# Patient Record
Sex: Male | Born: 1990 | Race: White | Hispanic: No | Marital: Married | Smoking: Former smoker
Health system: Southern US, Community
[De-identification: ages and names within clinical notes are randomized; demographics above are authoritative.]

## PROBLEM LIST (undated history)

## (undated) DIAGNOSIS — K219 Gastro-esophageal reflux disease without esophagitis: Secondary | ICD-10-CM

## (undated) DIAGNOSIS — F32A Depression, unspecified: Secondary | ICD-10-CM

## (undated) DIAGNOSIS — F509 Eating disorder, unspecified: Secondary | ICD-10-CM

## (undated) HISTORY — DX: Depression, unspecified: F32.A

## (undated) HISTORY — DX: Gastro-esophageal reflux disease without esophagitis: K21.9

## (undated) HISTORY — DX: Eating disorder, unspecified: F50.9

---
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
## 2013-12-30 ENCOUNTER — Ambulatory Visit: Payer: Self-pay | Admitting: Physician Assistant

## 2015-02-18 IMAGING — CR LEFT WRIST - COMPLETE 3+ VIEW
1 series · 5 of 5 positions shown · non-contrast
Comparison: None.

CLINICAL DATA: Left wrist pain.

EXAM:
LEFT WRIST - COMPLETE 3+ VIEW

[Series 1: lat · 0.17mm/px · 5 of 5 slices shown]
[im 1/5]
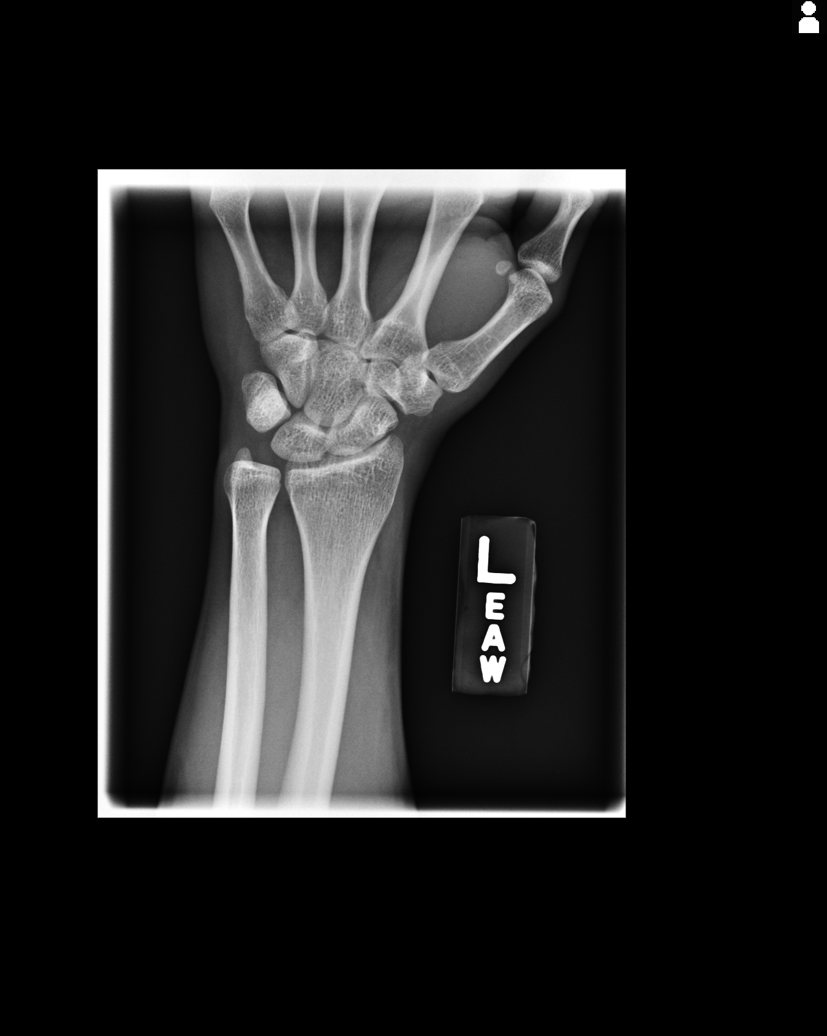
[im 2/5]
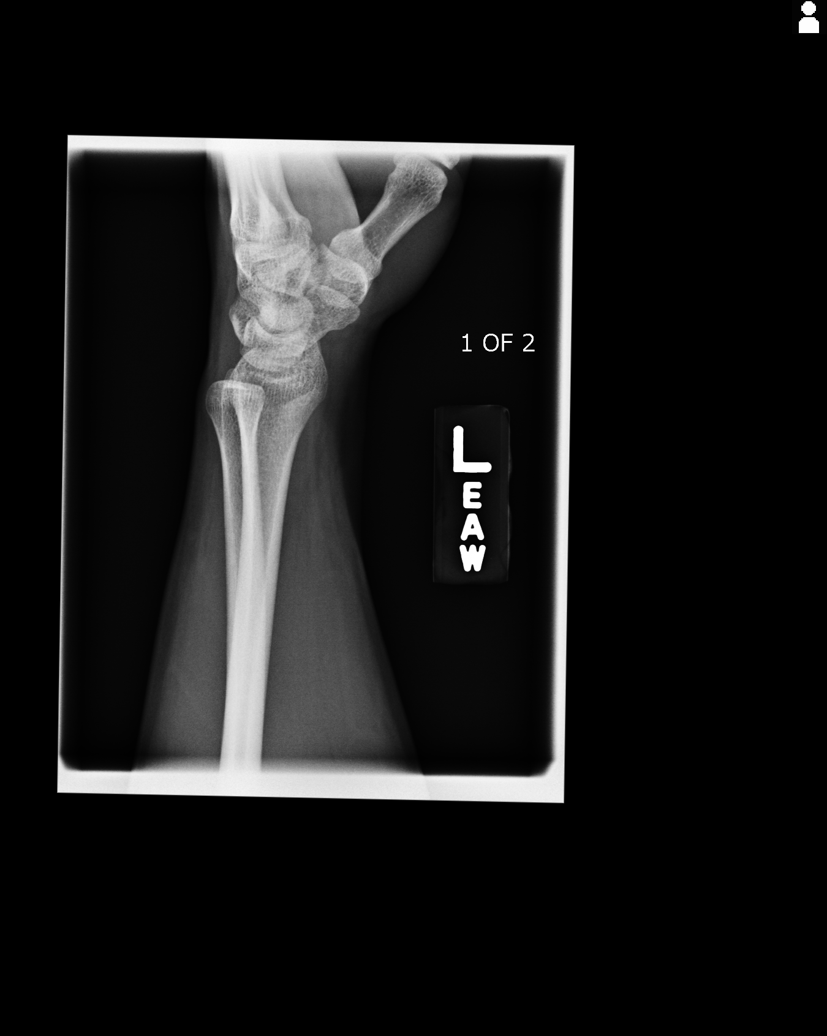
[im 3/5]
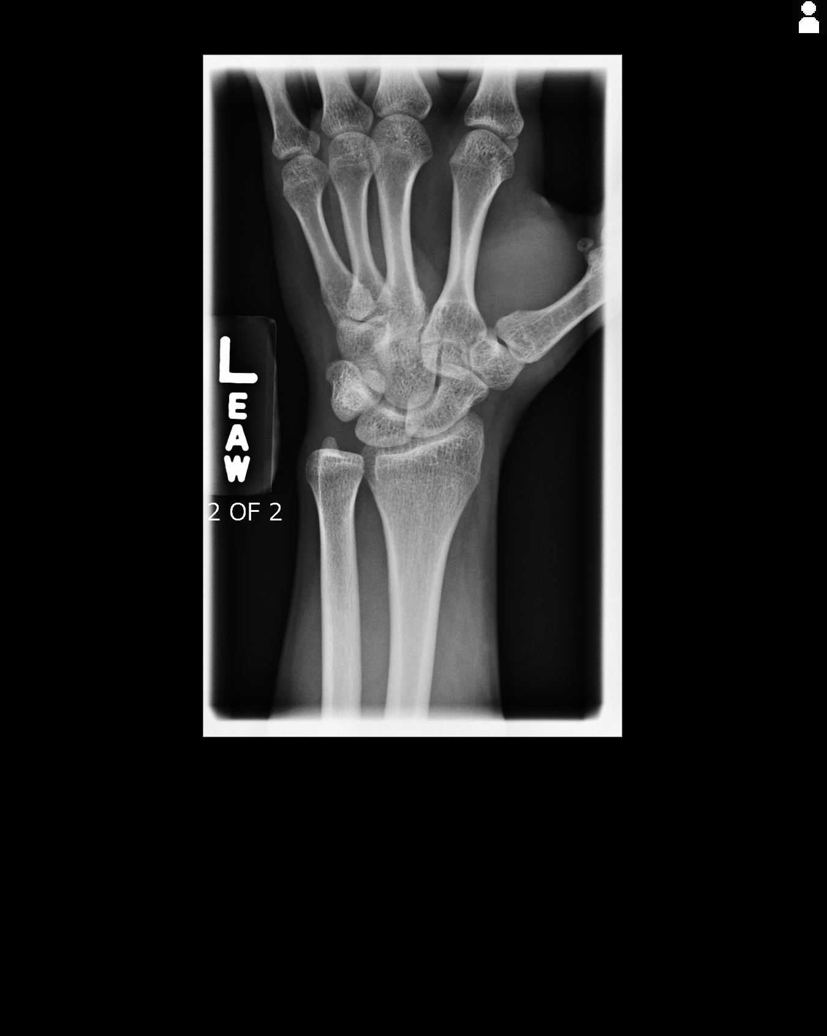
[im 4/5]
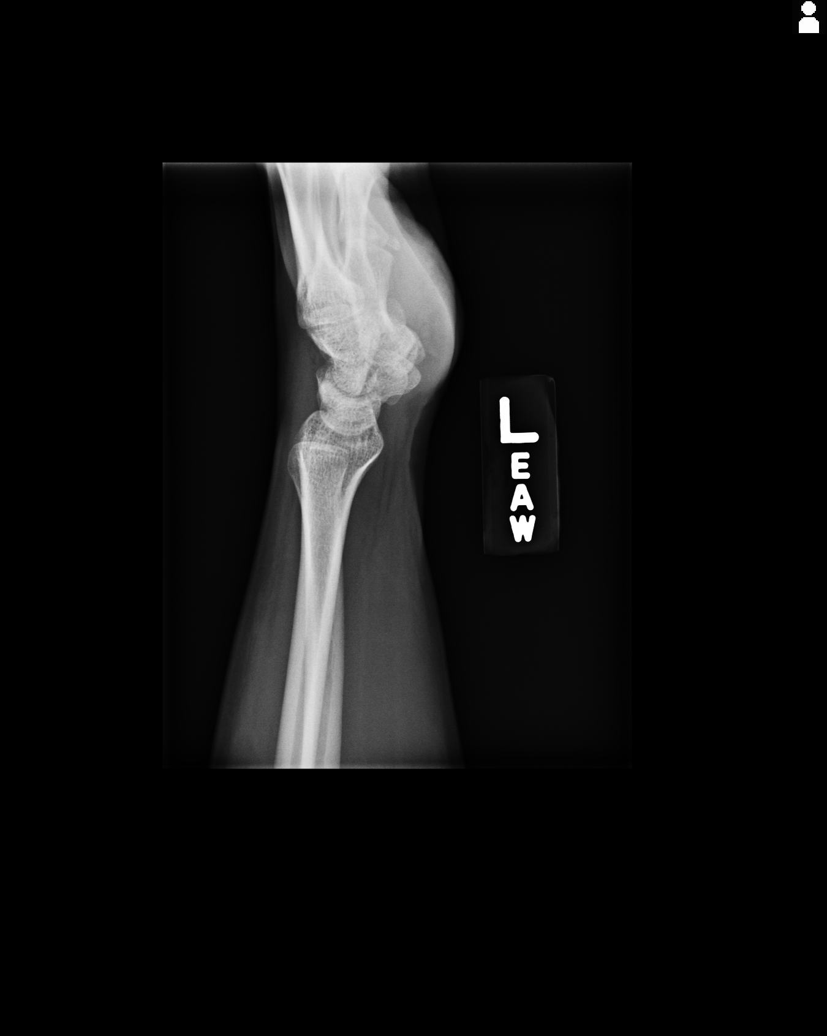
[im 5/5]
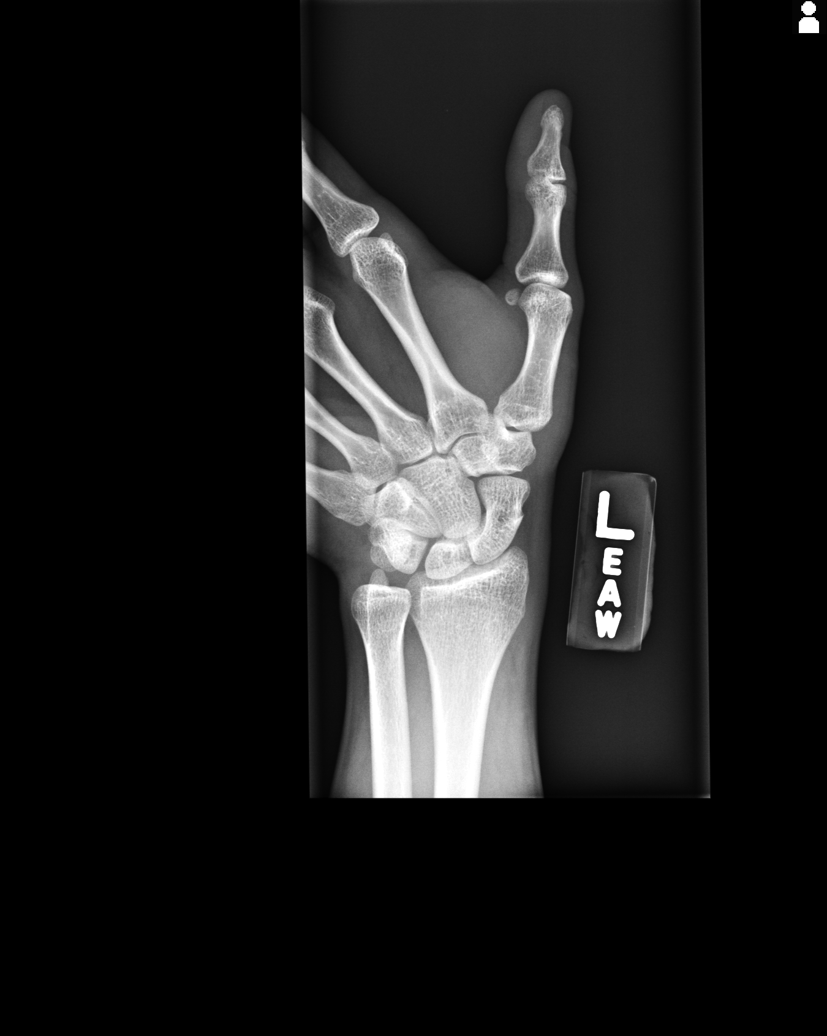

[5 of 5 positions shown; findings below may reference images not displayed]

FINDINGS: There is no evidence of fracture or dislocation. There is no
evidence of arthropathy or other focal bone abnormality. Soft
tissues are unremarkable.
IMPRESSION: Normal left wrist.

## 2015-12-13 ENCOUNTER — Ambulatory Visit
Admission: EM | Admit: 2015-12-13 | Discharge: 2015-12-13 | Disposition: A | Discharge status: Home / Self Care | Payer: Self-pay | Attending: Family Medicine | Admitting: Family Medicine

## 2015-12-13 ENCOUNTER — Encounter: Payer: Self-pay | Admitting: Emergency Medicine

## 2015-12-13 ENCOUNTER — Ambulatory Visit (INDEPENDENT_AMBULATORY_CARE_PROVIDER_SITE_OTHER): Payer: Self-pay

## 2015-12-13 DIAGNOSIS — S99922A Unspecified injury of left foot, initial encounter: Secondary | ICD-10-CM

## 2015-12-13 MED ORDER — NAPROXEN 500 MG PO TABS: 500.0000 mg | ORAL_TABLET | Freq: Two times a day (BID) | ORAL | Status: DC

## 2015-12-13 NOTE — ED Notes (Signed)
Patient states that on New Years Eve he jumped off an 298ft wall and landed on his left heel.  Patient c/o pain in his left heel for that past 2 weeks.

## 2015-12-13 NOTE — ED Provider Notes (Signed)
Patient presents today with symptoms of left heel pain. Patient states that he jumped off a 8 foot wall on New Year's Eve. He states that he has been wearing his boot since the injury. He states that the boot has been helping his pain. He denies any other injury anywhere else. He does state that there was bruising initially however it is now gone.  ROS: Negative except mentioned above.  Vitals as per Epic. GENERAL: NAD MSK: Left Foot- no ecchymosis or swelling appreciated, there is mild to moderate tenderness at the insertion of the plantar fascia at the calcaneus, there is pain upon squeezing the calcaneus, there is no arch pain or other foot pain, full range of motion, NV intact  NEURO: CN II-XII grossly intact   A/P: L Heel Injury- Xrays of the foot didn't show any fracture, will refer patient to orthopedics for further evaluation and treatment, patient may require MRI to evaluate for fracture, etc. Patient states that he will continue to wear the boot that he has been wearing, Naprosyn prescribed to use when necessary, discussed icing the area as well.   Jolene ProvostKirtida Brigitte Soderberg, MD 12/13/15 1719

## 2019-11-11 ENCOUNTER — Other Ambulatory Visit: Payer: Self-pay

## 2019-11-11 ENCOUNTER — Other Ambulatory Visit
Admission: RE | Admit: 2019-11-11 | Discharge: 2019-11-11 | Disposition: A | Discharge status: Home / Self Care | Payer: Self-pay | Source: Ambulatory Visit | Attending: Gastroenterology | Admitting: Gastroenterology

## 2019-11-11 ENCOUNTER — Ambulatory Visit: Payer: Self-pay | Admitting: Gastroenterology

## 2019-11-11 ENCOUNTER — Telehealth: Payer: Self-pay | Admitting: Gastroenterology

## 2019-11-11 ENCOUNTER — Encounter: Payer: Self-pay | Admitting: Gastroenterology

## 2019-11-11 VITALS — BP 105/64 | HR 67 | Temp 98.9°F | Ht 66.0 in | Wt 147.0 lb

## 2019-11-11 DIAGNOSIS — Z01812 Encounter for preprocedural laboratory examination: Secondary | ICD-10-CM | POA: Insufficient documentation

## 2019-11-11 DIAGNOSIS — R1319 Other dysphagia: Secondary | ICD-10-CM

## 2019-11-11 DIAGNOSIS — Z20828 Contact with and (suspected) exposure to other viral communicable diseases: Secondary | ICD-10-CM | POA: Insufficient documentation

## 2019-11-11 DIAGNOSIS — R1310 Dysphagia, unspecified: Secondary | ICD-10-CM

## 2019-11-11 LAB — SARS CORONAVIRUS 2 (TAT 6-24 HRS): SARS Coronavirus 2: NEGATIVE

## 2019-11-11 NOTE — Telephone Encounter (Signed)
Patient called & was unable to get her refill.She has  Made an  appointment for 01-31-20. She is asking for Polyethlen Glycol 3350 powder,Bental 20mg  (1tab 4x's aday as needed) & Prilosec 20 mg (2x's a day) called into [facility] in [place]. Please call to confirm medication was called in or not.    (DR Vanga's patient)

## 2019-11-11 NOTE — Telephone Encounter (Signed)
This message was sent by the front desk in error. Disregard.

## 2019-11-11 NOTE — Progress Notes (Signed)
Gastroenterology Consultation  Referring Provider:     No ref. provider found Primary Care Physician:  Patient, No Pcp Per Primary Gastroenterologist:  Dr. Allen Norris     Reason for Consultation:     Dysphagia        HPI:   Brad Nichols is a 28 y.o. y/o male referred for consultation & management of dysphagia by Dr. Patient, No Pcp Per.  This patient comes in today with a report of dysphagia that has been going on for the last 80 days.  The patient states that he has a 14 lbs lost in that time.  The patient was seen by ENT and recommended to come see me.  He states that he has a protein drink in the morning with yogurt and broth.  The patient continues to lose weight despite doing this.  He also reports that he is expecting his first child and has been under a lot of stress.  He was tried on a PPI without much relief.  He reports that the symptoms are worse with solids than they are with liquids and he is able to tolerate liquids.  The patient denies any vomiting of blood.  The patient also reports that the PPI made him constipated and he did have some rectal bleeding when he was constipated.  He reports that that has stopped.  History reviewed. No pertinent past medical history.  History reviewed. No pertinent surgical history.  Prior to Admission medications   Medication Sig Start Date End Date Taking? Authorizing Provider  naproxen (NAPROSYN) 500 MG tablet Take 1 tablet (500 mg total) by mouth 2 (two) times daily. Take with food. 12/13/15   Paulina Fusi, MD    History reviewed. No pertinent family history.   Social History   Tobacco Use  . Smoking status: Never Smoker  . Smokeless tobacco: Never Used  Substance Use Topics  . Alcohol use: Yes  . Drug use: Never    Allergies as of 11/11/2019  . (No Known Allergies)    Review of Systems:    All systems reviewed and negative except where noted in HPI.   Physical Exam:  BP 105/64   Pulse 67   Temp 98.9 F (37.2 C) (Oral)    Ht 5\' 6"  (1.676 m)   Wt 147 lb (66.7 kg)   BMI 23.73 kg/m  No LMP for male patient. General:   Alert,  Well-developed, well-nourished, pleasant and cooperative in NAD Head:  Normocephalic and atraumatic. Eyes:  Sclera clear, no icterus.   Conjunctiva pink. Ears:  Normal auditory acuity. Neck:  Supple; no masses or thyromegaly. Lungs:  Respirations even and unlabored.  Clear throughout to auscultation.   No wheezes, crackles, or rhonchi. No acute distress. Heart:  Regular rate and rhythm; no murmurs, clicks, rubs, or gallops. Abdomen:  Normal bowel sounds.  No bruits.  Soft, non-tender and non-distended without masses, hepatosplenomegaly or hernias noted.  No guarding or rebound tenderness.  Negative Carnett sign.   Rectal:  Deferred.  Msk:  Symmetrical without gross deformities.  Good, equal movement & strength bilaterally. Pulses:  Normal pulses noted. Extremities:  No clubbing or edema.  No cyanosis. Neurologic:  Alert and oriented x3;  grossly normal neurologically. Skin:  Intact without significant lesions or rashes.  No jaundice. Lymph Nodes:  No significant cervical adenopathy. Psych:  Alert and cooperative. Normal mood and affect.  Imaging Studies: No results found.  Assessment and Plan:   Brad Nichols is a 28  y.o. y/o male who comes in today with 80 days of dysphagia and a 14 pound weight loss.  The patient has dysphagia to solids more than liquids but reports that it has not gotten better.  The patient is now quite concerned because of the dysphagia and the weight loss and it is adding to the stress of him expecting his first child.  The patient will be set up for an EGD to look for any strictures narrowing or signs of eosinophilic esophagitis.  The patient has been explained the plan and agrees with it.    Midge Minium, MD. Clementeen Graham    Note: This dictation was prepared with Dragon dictation along with smaller phrase technology. Any transcriptional errors that result  from this process are unintentional.

## 2019-11-11 NOTE — H&P (View-Only) (Signed)
Gastroenterology Consultation  Referring Provider:     No ref. provider found Primary Care Physician:  Brad Nichols, No Pcp Per Primary Gastroenterologist:  Dr. Allen Norris     Reason for Consultation:     Dysphagia        HPI:   Brad Nichols is a 28 y.o. y/o male referred for consultation & management of dysphagia by Dr. Patient, No Pcp Per.  This Brad Nichols comes in today with a report of dysphagia that has been going on for the last 80 days.  The Brad Nichols states that he has a 14 lbs lost in that time.  The Brad Nichols was seen by ENT and recommended to come see me.  He states that he has a protein drink in the morning with yogurt and broth.  The Brad Nichols continues to lose weight despite doing this.  He also reports that he is expecting his first child and has been under a lot of stress.  He was tried on a PPI without much relief.  He reports that the symptoms are worse with solids than they are with liquids and he is able to tolerate liquids.  The Brad Nichols denies any vomiting of blood.  The Brad Nichols also reports that the PPI made him constipated and he did have some rectal bleeding when he was constipated.  He reports that that has stopped.  History reviewed. No pertinent past medical history.  History reviewed. No pertinent surgical history.  Prior to Admission medications   Medication Sig Start Date End Date Taking? Authorizing Provider  naproxen (NAPROSYN) 500 MG tablet Take 1 tablet (500 mg total) by mouth 2 (two) times daily. Take with food. 12/13/15   Paulina Fusi, MD    History reviewed. No pertinent family history.   Social History   Tobacco Use  . Smoking status: Never Smoker  . Smokeless tobacco: Never Used  Substance Use Topics  . Alcohol use: Yes  . Drug use: Never    Allergies as of 11/11/2019  . (No Known Allergies)    Review of Systems:    All systems reviewed and negative except where noted in HPI.   Physical Exam:  BP 105/64   Pulse 67   Temp 98.9 F (37.2 C) (Oral)    Ht 5\' 6"  (1.676 m)   Wt 147 lb (66.7 kg)   BMI 23.73 kg/m  No LMP for male Brad Nichols. General:   Alert,  Well-developed, well-nourished, pleasant and cooperative in NAD Head:  Normocephalic and atraumatic. Eyes:  Sclera clear, no icterus.   Conjunctiva pink. Ears:  Normal auditory acuity. Neck:  Supple; no masses or thyromegaly. Lungs:  Respirations even and unlabored.  Clear throughout to auscultation.   No wheezes, crackles, or rhonchi. No acute distress. Heart:  Regular rate and rhythm; no murmurs, clicks, rubs, or gallops. Abdomen:  Normal bowel sounds.  No bruits.  Soft, non-tender and non-distended without masses, hepatosplenomegaly or hernias noted.  No guarding or rebound tenderness.  Negative Carnett sign.   Rectal:  Deferred.  Msk:  Symmetrical without gross deformities.  Good, equal movement & strength bilaterally. Pulses:  Normal pulses noted. Extremities:  No clubbing or edema.  No cyanosis. Neurologic:  Alert and oriented x3;  grossly normal neurologically. Skin:  Intact without significant lesions or rashes.  No jaundice. Lymph Nodes:  No significant cervical adenopathy. Psych:  Alert and cooperative. Normal mood and affect.  Imaging Studies: No results found.  Assessment and Plan:   [name] is a 28  y.o. y/o male who comes in today with 80 days of dysphagia and a 14 pound weight loss.  The Brad Nichols has dysphagia to solids more than liquids but reports that it has not gotten better.  The Brad Nichols is now quite concerned because of the dysphagia and the weight loss and it is adding to the stress of him expecting his first child.  The Brad Nichols will be set up for an EGD to look for any strictures narrowing or signs of eosinophilic esophagitis.  The Brad Nichols has been explained the plan and agrees with it.    Midge Minium, MD. Clementeen Graham    Note: This dictation was prepared with Dragon dictation along with smaller phrase technology. Any transcriptional errors that result  from this process are unintentional.

## 2019-11-11 NOTE — Telephone Encounter (Signed)
Patient had a appointment today with Dr. Allen Norris please advised

## 2019-11-16 ENCOUNTER — Ambulatory Visit: Payer: Self-pay | Admitting: Certified Registered Nurse Anesthetist

## 2019-11-16 ENCOUNTER — Encounter: Payer: Self-pay | Admitting: Gastroenterology

## 2019-11-16 ENCOUNTER — Encounter
Admission: RE | Disposition: A | Discharge status: Home / Self Care | Payer: Self-pay | Source: Home / Self Care | Attending: Gastroenterology

## 2019-11-16 ENCOUNTER — Ambulatory Visit
Admission: RE | Admit: 2019-11-16 | Discharge: 2019-11-16 | Disposition: A | Discharge status: Home / Self Care | Payer: Self-pay | Attending: Gastroenterology | Admitting: Gastroenterology

## 2019-11-16 DIAGNOSIS — R1310 Dysphagia, unspecified: Secondary | ICD-10-CM | POA: Insufficient documentation

## 2019-11-16 DIAGNOSIS — R1319 Other dysphagia: Secondary | ICD-10-CM

## 2019-11-16 DIAGNOSIS — Z791 Long term (current) use of non-steroidal anti-inflammatories (NSAID): Secondary | ICD-10-CM | POA: Insufficient documentation

## 2019-11-16 HISTORY — PX: ESOPHAGOGASTRODUODENOSCOPY (EGD) WITH PROPOFOL: SHX5813

## 2019-11-16 SURGERY — ESOPHAGOGASTRODUODENOSCOPY (EGD) WITH PROPOFOL
Anesthesia: General

## 2019-11-16 MED ORDER — SODIUM CHLORIDE 0.9 % IV SOLN: INTRAVENOUS | Status: DC

## 2019-11-16 MED ORDER — PROPOFOL 10 MG/ML IV BOLUS
INTRAVENOUS | Status: DC | PRN
  Administered 2019-11-16: 18 mg via INTRAVENOUS
  Administered 2019-11-16: 60 mg via INTRAVENOUS
  Administered 2019-11-16: 18 mg via INTRAVENOUS

## 2019-11-16 MED ORDER — FENTANYL CITRATE (PF) 100 MCG/2ML IJ SOLN
INTRAMUSCULAR | Status: DC | PRN
  Administered 2019-11-16 (×2): 50 ug via INTRAVENOUS

## 2019-11-16 MED ORDER — MIDAZOLAM HCL 2 MG/2ML IJ SOLN
INTRAMUSCULAR | Status: DC | PRN
  Administered 2019-11-16: 2 mg via INTRAVENOUS

## 2019-11-16 MED ORDER — MIDAZOLAM HCL 2 MG/2ML IJ SOLN
INTRAMUSCULAR | Status: AC
  Filled 2019-11-16: qty 2

## 2019-11-16 MED ORDER — PROPOFOL 500 MG/50ML IV EMUL
INTRAVENOUS | Status: DC | PRN
  Administered 2019-11-16: 140 ug/kg/min via INTRAVENOUS

## 2019-11-16 MED ORDER — PROPOFOL 500 MG/50ML IV EMUL
INTRAVENOUS | Status: AC
  Filled 2019-11-16: qty 50

## 2019-11-16 MED ORDER — FENTANYL CITRATE (PF) 100 MCG/2ML IJ SOLN
INTRAMUSCULAR | Status: AC
  Filled 2019-11-16: qty 2

## 2019-11-16 MED ORDER — LIDOCAINE HCL (CARDIAC) PF 100 MG/5ML IV SOSY
PREFILLED_SYRINGE | INTRAVENOUS | Status: DC | PRN
  Administered 2019-11-16: 50 mg via INTRAVENOUS

## 2019-11-16 NOTE — Transfer of Care (Signed)
Immediate Anesthesia Transfer of Care Note  Patient: Brad Nichols  Procedure(s) Performed: ESOPHAGOGASTRODUODENOSCOPY (EGD) WITH PROPOFOL (N/A )  Patient Location: PACU and Endoscopy Unit  Anesthesia Type:General  Level of Consciousness: drowsy  Airway & Oxygen Therapy: Patient Spontanous Breathing  Post-op Assessment: Report given to RN and Post -op Vital signs reviewed and stable  Post vital signs: Reviewed and stable  Last Vitals:  Vitals Value Taken Time  BP 82/55 11/16/19 1054  Temp    Pulse 54 11/16/19 1055  Resp 8 11/16/19 1055  SpO2 95 % 11/16/19 1055  Vitals shown include unvalidated device data.  Last Pain:  Vitals:   11/16/19 0943  TempSrc: Temporal  PainSc: 0-No pain         Complications: No apparent anesthesia complications

## 2019-11-16 NOTE — Anesthesia Post-op Follow-up Note (Signed)
Anesthesia QCDR form completed.        

## 2019-11-16 NOTE — Anesthesia Preprocedure Evaluation (Signed)
Anesthesia Evaluation  Patient identified by MRN, date of birth, ID band Patient awake    Reviewed: Allergy & Precautions, NPO status , Patient's Chart, lab work & pertinent test results  Airway Mallampati: II  TM Distance: >3 FB     Dental   Pulmonary neg pulmonary ROS,    Pulmonary exam normal        Cardiovascular negative cardio ROS Normal cardiovascular exam     Neuro/Psych negative neurological ROS  negative psych ROS   GI/Hepatic Neg liver ROS,   Endo/Other  negative endocrine ROS  Renal/GU negative Renal ROS  negative genitourinary   Musculoskeletal negative musculoskeletal ROS (+)   Abdominal Normal abdominal exam  (+)   Peds negative pediatric ROS (+)  Hematology negative hematology ROS (+)   Anesthesia Other Findings History reviewed. No pertinent past medical history.  Reproductive/Obstetrics                             Anesthesia Physical Anesthesia Plan  ASA: I  Anesthesia Plan: General   Post-op Pain Management:    Induction: Intravenous  PONV Risk Score and Plan:   Airway Management Planned: Nasal Cannula  Additional Equipment:   Intra-op Plan:   Post-operative Plan:   Informed Consent: I have reviewed the patients History and Physical, chart, labs and discussed the procedure including the risks, benefits and alternatives for the proposed anesthesia with the patient or authorized representative who has indicated his/her understanding and acceptance.     Dental advisory given  Plan Discussed with: CRNA and Surgeon  Anesthesia Plan Comments:         Anesthesia Quick Evaluation

## 2019-11-16 NOTE — Anesthesia Postprocedure Evaluation (Signed)
Anesthesia Post Note  Patient: Brad Nichols  Procedure(s) Performed: ESOPHAGOGASTRODUODENOSCOPY (EGD) WITH PROPOFOL (N/A )  Patient location during evaluation: Endoscopy Anesthesia Type: General Level of consciousness: awake and alert and oriented Pain management: pain level controlled Vital Signs Assessment: post-procedure vital signs reviewed and stable Respiratory status: spontaneous breathing Cardiovascular status: blood pressure returned to baseline Anesthetic complications: no     Last Vitals:  Vitals:   11/16/19 1125 11/16/19 1135  BP: 108/61 114/66  Pulse: (!) 55 62  Resp: (!) 9 15  Temp:    SpO2: 100% 100%    Last Pain:  Vitals:   11/16/19 1135  TempSrc:   PainSc: 0-No pain                 Jaymes Revels

## 2019-11-16 NOTE — Interval H&P Note (Signed)
History and Physical Interval Note:  11/16/2019 10:00 AM  Brad Nichols  has presented today for surgery, with the diagnosis of dysphagia R13.10.  The various methods of treatment have been discussed with the patient and family. After consideration of risks, benefits and other options for treatment, the patient has consented to  Procedure(s): ESOPHAGOGASTRODUODENOSCOPY (EGD) WITH PROPOFOL (N/A) as a surgical intervention.  The patient's history has been reviewed, patient examined, no change in status, stable for surgery.  I have reviewed the patient's chart and labs.  Questions were answered to the patient's satisfaction.     Rhea Thrun [employer]

## 2019-11-16 NOTE — Op Note (Signed)
[facility] Gastroenterology Patient Name: Brad Nichols Procedure Date: 11/16/2019 10:35 AM MRN: [id number] Account #: [id number] Date of Birth: Aug 20, 1991 Admit Type: Outpatient Age: [age] [facility] END[facility] ROOM 4 Gender: Male Note Status: Finalized Procedure:             Upper GI endoscopy Indications:           Dysphagia Providers:             Midge Miniumarren Auren Valdes MD, MD Referring MD:          No Local Md, MD (Referring MD) Medicines:             Propofol per Anesthesia Complications:         No immediate complications. Procedure:             Pre-Anesthesia Assessment:                        - Prior to the procedure, a History and Physical was                         performed, and patient medications and allergies were                         reviewed. The patient's tolerance of previous                         anesthesia was also reviewed. The risks and benefits                         of the procedure and the sedation options and risks                         were discussed with the patient. All questions were                         answered, and informed consent was obtained. Prior                         Anticoagulants: The patient has taken no previous                         anticoagulant or antiplatelet agents. ASA Grade                         Assessment: I - A normal, healthy patient. After                         reviewing the risks and benefits, the patient was                         deemed in satisfactory condition to undergo the                         procedure.                        After obtaining informed consent, the endoscope was  passed under direct vision. Throughout the procedure,                         the patient's blood pressure, pulse, and oxygen                         saturations were monitored continuously. The Endoscope                         was introduced through the mouth, and advanced to the           second part of duodenum. The upper GI endoscopy was                         accomplished without difficulty. The patient tolerated                         the procedure well. Findings:      The esophagus was normal.      The stomach was normal.      The examined duodenum was normal.      Two biopsies were obtained with cold forceps for histology in the middle       third of the esophagus, as well as two biopsies.      Two biopsies were obtained with cold forceps for histology in the       gastric antrum.      A TTS dilator was passed through the scope. Dilation with a 15-16.5-18       mm balloon dilator was performed to 18 mm in the entire esophagus. Impression:            - Normal esophagus.                        - Normal stomach.                        - Normal examined duodenum.                        - Biopsy performed in the middle third of the                         esophagus.                        - Biopsy performed in the gastric antrum.                        - Dilation performed in the entire esophagus. Recommendation:        - Discharge patient to home.                        - Resume previous diet.                        - Continue present medications.                        - Await pathology results. Procedure Code(s):     --- Professional ---  [phone], Esophagogastroduodenoscopy, flexible,                         transoral; with transendoscopic balloon dilation of                         esophagus (less than 30 mm diameter)                        [id number], 59, Esophagogastroduodenoscopy, flexible,                         transoral; with biopsy, single or multiple Diagnosis Code(s):     --- Professional ---                        R13.10, Dysphagia, unspecified CPT copyright 2019 American Medical Association. All rights reserved. The codes documented in this report are preliminary and upon coder review may  be revised to meet current  compliance requirements. Lucilla Lame MD, MD 11/16/2019 10:50:43 AM This report has been signed electronically. Number of Addenda: 0 Note Initiated On: 11/16/2019 10:35 AM Estimated Blood Loss:  Estimated blood loss: none.      [facility]

## 2019-11-17 ENCOUNTER — Encounter: Payer: Self-pay | Admitting: *Deleted

## 2019-11-17 ENCOUNTER — Other Ambulatory Visit: Payer: Self-pay

## 2019-11-17 LAB — SURGICAL PATHOLOGY

## 2019-11-18 ENCOUNTER — Other Ambulatory Visit: Payer: Self-pay

## 2019-11-18 ENCOUNTER — Telehealth: Payer: Self-pay

## 2019-11-18 MED ORDER — FLUTICASONE PROPIONATE HFA 220 MCG/ACT IN AERO: INHALATION_SPRAY | RESPIRATORY_TRACT | 12 refills | Status: DC

## 2019-11-18 MED ORDER — PANTOPRAZOLE SODIUM 40 MG PO TBEC: 40.0000 mg | DELAYED_RELEASE_TABLET | Freq: Every day | ORAL | 6 refills | Status: DC

## 2019-11-18 NOTE — Telephone Encounter (Signed)
Pt returned my call and was informed of results. Pt also filled out the patient assistance form for flovent HFA.

## 2019-11-18 NOTE — Telephone Encounter (Signed)
LVM for pt to return my call.

## 2019-11-18 NOTE — Telephone Encounter (Signed)
-----   Message from Lucilla Lame, MD sent at 11/17/2019 12:56 PM EST ----- Please let the patient know that the biopsies of his esophagus showed him to have findings consistent with eosinophilic esophagitis which can be causing his symptoms.  Have the patient start on a PPI, 40 mg of Protonix daily and fluticasone.

## 2019-12-27 ENCOUNTER — Other Ambulatory Visit: Payer: Self-pay

## 2019-12-27 ENCOUNTER — Telehealth: Payer: Self-pay | Admitting: Gastroenterology

## 2019-12-27 MED ORDER — CLOTRIMAZOLE 10 MG MT TROC: 10.0000 mg | Freq: Every day | OROMUCOSAL | 0 refills | Status: DC

## 2019-12-27 NOTE — Telephone Encounter (Signed)
Pt called wanting to know if we can call something in for possible thrush from the fluticasone that he is taking for his EOE. Also, he would like a referral to ENT for allergy testing or if we can do a allergy profile using blood work first he would prefer that.   If ok for the medication for thrush, please let me know what medication you choose.

## 2019-12-27 NOTE — Telephone Encounter (Signed)
Pt notified of rx and referral to [facility] ENT.

## 2019-12-27 NOTE — Telephone Encounter (Signed)
Pt left vm to speak to Ginger regarding questions on some medication he was given please call pt

## 2019-12-27 NOTE — Telephone Encounter (Signed)
You can send him for and ENT referral.  Clotrimazole troches (10 mg orally 5 times daily) for 10 days.

## 2020-01-03 ENCOUNTER — Encounter: Payer: Self-pay | Admitting: Emergency Medicine

## 2020-01-03 ENCOUNTER — Emergency Department
Admission: EM | Admit: 2020-01-03 | Discharge: 2020-01-03 | Disposition: A | Discharge status: Home / Self Care | Payer: BC Managed Care – PPO | Attending: Emergency Medicine | Admitting: Emergency Medicine

## 2020-01-03 ENCOUNTER — Other Ambulatory Visit: Payer: Self-pay

## 2020-01-03 ENCOUNTER — Telehealth: Payer: Self-pay

## 2020-01-03 DIAGNOSIS — R1310 Dysphagia, unspecified: Secondary | ICD-10-CM | POA: Diagnosis not present

## 2020-01-03 DIAGNOSIS — R55 Syncope and collapse: Secondary | ICD-10-CM

## 2020-01-03 LAB — COMPREHENSIVE METABOLIC PANEL
ALT: 18 U/L (ref 0–44)
AST: 19 U/L (ref 15–41)
Albumin: 4.6 g/dL (ref 3.5–5.0)
Alkaline Phosphatase: 78 U/L (ref 38–126)
Anion gap: 8 (ref 5–15)
BUN: 10 mg/dL (ref 6–20)
CO2: 26 mmol/L (ref 22–32)
Calcium: 9.2 mg/dL (ref 8.9–10.3)
Chloride: 106 mmol/L (ref 98–111)
Creatinine, Ser: 0.78 mg/dL (ref 0.61–1.24)
GFR calc Af Amer: >60 mL/min (ref >60)
GFR calc non Af Amer: >60 mL/min (ref >60)
Glucose, Bld: 101 mg/dL — ABNORMAL HIGH (ref 70–99)
Potassium: 3.8 mmol/L (ref 3.5–5.1)
Sodium: 140 mmol/L (ref 135–145)
Total Bilirubin: 1.4 mg/dL — ABNORMAL HIGH (ref 0.3–1.2)
Total Protein: 7.2 g/dL (ref 6.5–8.1)

## 2020-01-03 LAB — CBC WITH DIFFERENTIAL/PLATELET
Abs Immature Granulocytes: 0.01 10*3/uL (ref 0.00–0.07)
Basophils Absolute: 0 10*3/uL (ref 0.0–0.1)
Basophils Relative: 1 %
Eosinophils Absolute: 0.2 10*3/uL (ref 0.0–0.5)
Eosinophils Relative: 2 %
HCT: 44.1 % (ref 39.0–52.0)
Hemoglobin: 15.2 g/dL (ref 13.0–17.0)
Immature Granulocytes: 0 %
Lymphocytes Relative: 32 %
Lymphs Abs: 2.1 10*3/uL (ref 0.7–4.0)
MCH: 28.4 pg (ref 26.0–34.0)
MCHC: 34.5 g/dL (ref 30.0–36.0)
MCV: 82.3 fL (ref 80.0–100.0)
Monocytes Absolute: 0.4 10*3/uL (ref 0.1–1.0)
Monocytes Relative: 7 %
Neutro Abs: 3.9 10*3/uL (ref 1.7–7.7)
Neutrophils Relative %: 58 %
Platelets: 243 10*3/uL (ref 150–400)
RBC: 5.36 MIL/uL (ref 4.22–5.81)
RDW: 11.4 % — ABNORMAL LOW (ref 11.5–15.5)
WBC: 6.6 10*3/uL (ref 4.0–10.5)
nRBC: 0 % (ref 0.0–0.2)

## 2020-01-03 LAB — URINALYSIS, COMPLETE (UACMP) WITH MICROSCOPIC
Bacteria, UA: NONE SEEN
Bilirubin Urine: NEGATIVE
Glucose, UA: NEGATIVE mg/dL
Hgb urine dipstick: NEGATIVE
Ketones, ur: NEGATIVE mg/dL
Leukocytes,Ua: NEGATIVE
Nitrite: NEGATIVE
Protein, ur: NEGATIVE mg/dL
Specific Gravity, Urine: 1.004 — ABNORMAL LOW (ref 1.005–1.030)
Squamous Epithelial / HPF: NONE SEEN (ref 0–5)
WBC, UA: NONE SEEN WBC/hpf (ref 0–5)
pH: 8 (ref 5.0–8.0)

## 2020-01-03 LAB — TROPONIN I (HIGH SENSITIVITY)
Troponin I (High Sensitivity): 2 ng/L (ref <18)
Troponin I (High Sensitivity): <2 ng/L (ref <18)

## 2020-01-03 MED ORDER — SODIUM CHLORIDE 0.9 % IV BOLUS
1000.0000 mL | Freq: Once | INTRAVENOUS | Status: AC
  Administered 2020-01-03: 09:00:00 1000 mL via INTRAVENOUS

## 2020-01-03 NOTE — ED Triage Notes (Signed)
Patient states "I haven't eaten since October and I passed out this morning".  Patient seen by  Dr. Daleen Squibb for GI.

## 2020-01-03 NOTE — ED Provider Notes (Signed)
[facility] Emergency Department Provider Note       Time seen: ----------------------------------------- 8:58 AM on 01/03/2020 -----------------------------------------   I have reviewed the triage vital signs and the nursing notes.  HISTORY   Chief Complaint Near Syncope    HPI Brad Nichols is a 29 y.o. male with a history of esophageal dysphagia who presents to the ED for syncope.  Patient states he has not eaten since October and he passed out this morning.  He has recently seen Dr. Allen Nichols for GI.  He denies any pain.  Patient states he has been living off of mostly liquid diet.  History reviewed. No pertinent past medical history.  Patient Active Problem List   Diagnosis Date Noted  . Esophageal dysphagia     Past Surgical History:  Procedure Laterality Date  . ESOPHAGOGASTRODUODENOSCOPY (EGD) WITH PROPOFOL N/A 11/16/2019   Procedure: ESOPHAGOGASTRODUODENOSCOPY (EGD) WITH PROPOFOL;  Surgeon: Brad Lame, MD;  Location: [facility] ENDOSCOPY;  Service: Endoscopy;  Laterality: N/A;    Allergies Molds & smuts  Social History Social History   Tobacco Use  . Smoking status: Never Smoker  . Smokeless tobacco: Never Used  Substance Use Topics  . Alcohol use: Not Currently  . Drug use: Never    Review of Systems Constitutional: Negative for fever. HEENT: Positive for difficulty swallowing Cardiovascular: Negative for chest pain. Respiratory: Negative for shortness of breath. Gastrointestinal: Negative for abdominal pain, vomiting and diarrhea. Musculoskeletal: Negative for back pain. Skin: Negative for rash. Neurological: Negative for headaches, focal weakness or numbness.  All systems negative/normal/unremarkable except as stated in the HPI  ____________________________________________   PHYSICAL EXAM:  VITAL SIGNS: ED Triage Vitals  Enc Vitals Group     BP 01/03/20 0849 132/76     Pulse Rate 01/03/20 0849 (!) 59     Resp  01/03/20 0849 15     Temp 01/03/20 0849 98.6 F (37 C)     Temp Source 01/03/20 0849 Oral     SpO2 01/03/20 0849 99 %     Weight 01/03/20 0842 154 lb 1.6 oz (69.9 kg)     Height --      Head Circumference --      Peak Flow --      Pain Score 01/03/20 0842 0     Pain Loc --      Pain Edu? --      Excl. in [place]? --    Constitutional: Alert and oriented. Well appearing and in no distress. Eyes: Conjunctivae are normal. Normal extraocular movements. ENT      Head: Normocephalic and atraumatic.      Nose: No congestion/rhinnorhea.      Mouth/Throat: Mucous membranes are moist.      Neck: No stridor. Cardiovascular: Normal rate, regular rhythm. No murmurs, rubs, or gallops. Respiratory: Normal respiratory effort without tachypnea nor retractions. Breath sounds are clear and equal bilaterally. No wheezes/rales/rhonchi. Gastrointestinal: Soft and nontender. Normal bowel sounds Musculoskeletal: Nontender with normal range of motion in extremities. No lower extremity tenderness nor edema. Neurologic:  Normal speech and language. No gross focal neurologic deficits are appreciated.  Skin:  Skin is warm, dry and intact. No rash noted. Psychiatric: Mood and affect are normal. Speech and behavior are normal.  ____________________________________________  EKG: Interpreted by me.  Sinus rhythm rate of 62 bpm, borderline right axis, normal QT  ____________________________________________  ED COURSE:  As part of my medical decision making, I reviewed the following data within the [place]  History obtained from family if available, nursing notes, old chart and ekg, as well as notes from prior ED visits. Patient presented for syncope, we will assess with labs and imaging as indicated at this time.   Procedures  Brad Nichols was evaluated in Emergency Department on 01/03/2020 for the symptoms described in the history of present illness. He was evaluated in the context of the  global COVID-19 pandemic, which necessitated consideration that the patient might be at risk for infection with the SARS-CoV-2 virus that causes COVID-19. Institutional protocols and algorithms that pertain to the evaluation of patients at risk for COVID-19 are in a state of rapid change based on information released by regulatory bodies including the CDC and federal and state organizations. These policies and algorithms were followed during the patient's care in the ED.  ____________________________________________   LABS (pertinent positives/negatives)  Labs Reviewed  CBC WITH DIFFERENTIAL/PLATELET - Abnormal; Notable for the following components:      Result Value   RDW 11.4 (*)    All other components within normal limits  COMPREHENSIVE METABOLIC PANEL - Abnormal; Notable for the following components:   Glucose, Bld 101 (*)    Total Bilirubin 1.4 (*)    All other components within normal limits  URINALYSIS, COMPLETE (UACMP) WITH MICROSCOPIC  TROPONIN I (HIGH SENSITIVITY)  TROPONIN I (HIGH SENSITIVITY)   ____________________________________________   DIFFERENTIAL DIAGNOSIS   Dysphagia, dehydration, electrolyte abnormality, arrhythmia, MI  FINAL ASSESSMENT AND PLAN  Dysphagia, syncope   Plan: The patient had presented for syncope likely related to his GI issues.  He was given IV fluids here on arrival.  Patient's labs did not reveal any acute process.  I will discuss with gastroenterologist on-call.   Ulice Dash, MD    Note: This note was generated in part or whole with voice recognition software. Voice recognition is usually quite accurate but there are transcription errors that can and very often do occur. I apologize for any typographical errors that were not detected and corrected.     Emily Filbert, MD 01/03/20 1150

## 2020-01-03 NOTE — Telephone Encounter (Signed)
Patient wife is calling because patient has been diagnosed with EOE. Patient wife states that the patient has not been really eating lately. The only thing he is eating is yogurt and ensure. Patient passed out this morning and ever since then he has been shaking and going from hot and cold. Advised wife to take patient to the ER. Patient wife states she will take him to the ER right now.

## 2020-01-04 ENCOUNTER — Telehealth: Payer: Self-pay | Admitting: Gastroenterology

## 2020-01-04 NOTE — Telephone Encounter (Signed)
Patient called & has an appointment in [facility] on 02-14-20 as a f/u from th ED. Patient is concerned about is diet of ensure. Possibility he may need a feeding tube and referral to [facility]. Please call to speak with this patient.

## 2020-01-04 NOTE — Telephone Encounter (Signed)
Can call him back and see if he can come this coming Thursday? Just double book him anywhere in the afternoon. Whichever works best for the pt. Just let him know Dr. Servando Snare will need to discuss the possibility of a feeding tube.  Thank you!!

## 2020-01-06 ENCOUNTER — Encounter: Payer: Self-pay | Admitting: Gastroenterology

## 2020-01-06 ENCOUNTER — Ambulatory Visit: Payer: BC Managed Care – PPO | Admitting: Gastroenterology

## 2020-01-06 ENCOUNTER — Other Ambulatory Visit: Payer: Self-pay

## 2020-01-06 VITALS — BP 123/78 | HR 94 | Temp 98.3°F | Ht 66.0 in | Wt 138.8 lb

## 2020-01-06 DIAGNOSIS — R1310 Dysphagia, unspecified: Secondary | ICD-10-CM

## 2020-01-06 DIAGNOSIS — R1319 Other dysphagia: Secondary | ICD-10-CM

## 2020-01-06 NOTE — Progress Notes (Signed)
Primary Care Physician: Patient, No Pcp Per  Primary Gastroenterologist:  Dr. Midge Minium  Chief Complaint  Patient presents with  . Follow up ER    HPI: Brad Nichols is a 29 y.o. male here for follow-up of dysphagia.  The patient had a upper endoscopy that showed increased eosinophils in the esophagus and the patient was treated for EOE.  The patient has had near syncope and went to the ER and states he has not been eating anything since October except for a yogurt and Ensure.  At the time of the upper endoscopy the patient had empirical dilation of his esophagus with a 15 to 18 mm balloon which was dredged up and down the esophagus.  The patient had reported some oral thrush on the fluticasone and was started on clotrimazole for that.  The patient had contacted our office asking for a possible feeding tube versus a referral to [facility] for his continued inability to eat.  The patient was also started on pantoprazole 40 mg a day. The patient was in the emergency room at [facility] and felt that he was not getting good answers so he went straight to the ER at [facility] yesterday.  The work-up with blood work and imaging did not show any cause for her symptoms.  The patient was made to follow-up with a [facility] family practice doctor and a GI consult at [facility].  Current Outpatient Medications  Medication Sig Dispense Refill  . clotrimazole (MYCELEX) 10 MG troche Take 1 tablet (10 mg total) by mouth 5 (five) times daily. For 10 days 50 Troche 0  . fluticasone (FLOVENT HFA) 220 MCG/ACT inhaler Take 2 puffs and swallow twice daily for 8 weeks. **Rinse mouth after each use** 1 Inhaler 12  . pantoprazole (PROTONIX) 40 MG tablet Take 1 tablet (40 mg total) by mouth daily. 30 tablet 6   No current facility-administered medications for this visit.    Allergies as of 01/06/2020 - Review Complete 01/06/2020  Allergen Reaction Noted  . Molds & smuts  11/16/2019    ROS:  General: Negative for anorexia, weight loss,  fever, chills, fatigue, weakness. ENT: Negative for hoarseness, difficulty swallowing , nasal congestion. CV: Negative for chest pain, angina, palpitations, dyspnea on exertion, peripheral edema.  Respiratory: Negative for dyspnea at rest, dyspnea on exertion, cough, sputum, wheezing.  GI: See history of present illness. GU:  Negative for dysuria, hematuria, urinary incontinence, urinary frequency, nocturnal urination.  Endo: Negative for unusual weight change.    Physical Examination:   BP 123/78   Pulse 94   Temp 98.3 F (36.8 C) (Oral)   Ht 5\' 6"  (1.676 m)   Wt 138 lb 12.8 oz (63 kg)   BMI 22.40 kg/m   General: Well-nourished, well-developed in no acute distress.  Eyes: No icterus. Conjunctivae pink. Extremities: No lower extremity edema. No clubbing or deformities. Neuro: Alert and oriented x 3.  Grossly intact. Skin: Warm and dry, no jaundice.   Psych: Alert and cooperative, normal mood and affect.  Labs:    Imaging Studies: No results found.  Assessment and Plan:   Brad Nichols is a 29 y.o. y/o male who comes in with a history of dysphagia with inability to eat anything but yogurt and Ensure.  He states that he can take his pills but when he tries to eat [place] or anything bigger than his pills he is unable to get it down.  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  The patient continues to have symptoms despite his esophagus being dilated to 18 mm.  The patient has been told that a second opinion from [facility] would be very helpful since nothing has been found during my work-up.  The patient may need esophageal manometry versus treatment for a functional disorder.  He has also been explained those possibilities.  I have asked the patient to contact me with the recommendations so he can keep me abreast of his progress.  The patient has been explained the plan and agrees with it.     Lucilla Lame, MD. Marval Regal    Note: This  dictation was prepared with Dragon dictation along with smaller phrase technology. Any transcriptional errors that result from this process are unintentional.

## 2020-01-13 DIAGNOSIS — F32A Depression, unspecified: Secondary | ICD-10-CM | POA: Insufficient documentation

## 2020-01-23 DIAGNOSIS — K200 Eosinophilic esophagitis: Secondary | ICD-10-CM | POA: Insufficient documentation

## 2020-02-14 ENCOUNTER — Ambulatory Visit: Payer: BC Managed Care – PPO | Admitting: Gastroenterology

## 2020-03-07 ENCOUNTER — Ambulatory Visit: Payer: BC Managed Care – PPO | Admitting: Gastroenterology

## 2022-09-12 ENCOUNTER — Ambulatory Visit
Admission: EM | Admit: 2022-09-12 | Discharge: 2022-09-12 | Disposition: A | Discharge status: Home / Self Care | Payer: BC Managed Care – PPO | Attending: Family Medicine | Admitting: Family Medicine

## 2022-09-12 ENCOUNTER — Other Ambulatory Visit: Payer: Self-pay

## 2022-09-12 DIAGNOSIS — S61215A Laceration without foreign body of left ring finger without damage to nail, initial encounter: Secondary | ICD-10-CM | POA: Diagnosis not present

## 2022-09-12 DIAGNOSIS — Z23 Encounter for immunization: Secondary | ICD-10-CM

## 2022-09-12 MED ORDER — TETANUS-DIPHTH-ACELL PERTUSSIS 5-2.5-18.5 LF-MCG/0.5 IM SUSY
0.5000 mL | PREFILLED_SYRINGE | Freq: Once | INTRAMUSCULAR | Status: AC
  Administered 2022-09-12: 0.5 mL via INTRAMUSCULAR

## 2022-09-12 MED ORDER — CEPHALEXIN 500 MG PO CAPS: 500.0000 mg | ORAL_CAPSULE | Freq: Three times a day (TID) | ORAL | 0 refills | Status: DC

## 2022-09-12 MED ORDER — CEPHALEXIN 500 MG PO CAPS: 500.0000 mg | ORAL_CAPSULE | Freq: Three times a day (TID) | ORAL | 0 refills | Status: AC

## 2022-09-12 NOTE — Discharge Instructions (Addendum)
Leave the dressing in place for the next 24 hours.  After 24 hours remove the dressing, wash the wound with warm water and soap, pat it dry, and apply a thin smear of bacitracin.  Clean the wound daily and apply bacitracin for the first 2 days.  After that a scab should have started to form and you can leave the wound open to air when you are at home and cover with a Band-Aid when you go out in public.  Take the Keflex three times daily for 5 days for prevention of wound infection.  Sutures remain in place for 10 days, please return here or see your primary care provider for removal.  If you develop any redness at the wound site, swelling, pain, drainage, red streaks going up your hand, or fever please return for reevaluation.  

## 2022-09-12 NOTE — ED Triage Notes (Signed)
Left hand 4th finger laceration on sheet metal. Bleeding controlled in triage

## 2022-09-12 NOTE — ED Provider Notes (Signed)
[facility]    CSN: [id number] Arrival date & time: 09/12/22  1703      History   Chief Complaint Chief Complaint  Patient presents with   Extremity Laceration    HPI Brad Nichols is a 31 y.o. male.   HPI  31 year old male here for evaluation of finger laceration.  Patient reports that around 2:00 this afternoon he cut the dorsal aspect of his left fourth finger on a piece of sheet metal.  He denies any numbness or tingling in his finger.  He has full range of motion sensation in his finger.  He is unsure when his last tetanus shot was.  History reviewed. No pertinent past medical history.  Patient Active Problem List   Diagnosis Date Noted   Esophageal dysphagia     Past Surgical History:  Procedure Laterality Date   ESOPHAGOGASTRODUODENOSCOPY (EGD) WITH PROPOFOL N/A 11/16/2019   Procedure: ESOPHAGOGASTRODUODENOSCOPY (EGD) WITH PROPOFOL;  Surgeon: Lucilla Lame, MD;  Location: [facility] ENDOSCOPY;  Service: Endoscopy;  Laterality: N/A;       Home Medications    Prior to Admission medications   Medication Sig Start Date End Date Taking? Authorizing Provider  cephALEXin (KEFLEX) 500 MG capsule Take 1 capsule (500 mg total) by mouth 3 (three) times daily for 5 days. 09/12/22 09/17/22  Margarette Canada, NP    Family History History reviewed. No pertinent family history.  Social History Social History   Tobacco Use   Smoking status: Never   Smokeless tobacco: Never  Vaping Use   Vaping Use: Never used  Substance Use Topics   Alcohol use: Not Currently   Drug use: Never     Allergies   Beef allergy and Molds & smuts   Review of Systems Review of Systems  Constitutional:  Negative for fever.  Skin:  Positive for wound. Negative for color change.  Neurological:  Negative for weakness and numbness.  Hematological: Negative.      Physical Exam Triage Vital Signs ED Triage Vitals  Enc Vitals Group     BP 09/12/22 1744 123/79     Pulse  Rate 09/12/22 1744 73     Resp 09/12/22 1744 16     Temp 09/12/22 1744 99 F (37.2 C)     Temp Source 09/12/22 1744 Oral     SpO2 09/12/22 1744 99 %     Weight 09/12/22 1742 162 lb (73.5 kg)     Height 09/12/22 1742 5\' 6"  (1.676 m)     Head Circumference --      Peak Flow --      Pain Score 09/12/22 1741 0     Pain Loc --      Pain Edu? --      Excl. in [place]? --    No data found.  Updated Vital Signs BP 123/79 (BP Location: Left Arm)   Pulse 73   Temp 99 F (37.2 C) (Oral)   Resp 16   Ht 5\' 6"  (1.676 m)   Wt 162 lb (73.5 kg)   SpO2 99%   BMI 26.15 kg/m   Visual Acuity Right Eye Distance:   Left Eye Distance:   Bilateral Distance:    Right Eye Near:   Left Eye Near:    Bilateral Near:     Physical Exam Vitals and nursing note reviewed.  Constitutional:      Appearance: Normal appearance. He is not ill-appearing.  HENT:     Head: Normocephalic and atraumatic.  Musculoskeletal:  General: Tenderness and signs of injury present. No swelling or deformity. Normal range of motion.  Skin:    General: Skin is warm and dry.     Capillary Refill: Capillary refill takes less than 2 seconds.     Findings: No bruising or erythema.  Neurological:     General: No focal deficit present.     Mental Status: He is alert and oriented to person, place, and time.     Sensory: No sensory deficit.  Psychiatric:        Mood and Affect: Mood normal.        Behavior: Behavior normal.        Thought Content: Thought content normal.        Judgment: Judgment normal.      UC Treatments / Results  Labs (all labs ordered are listed, but only abnormal results are displayed) Labs Reviewed - No data to display  EKG   Radiology No results found.  Procedures Procedures (including critical care time)  Medications Ordered in UC Medications  Tdap (BOOSTRIX) injection 0.5 mL (0.5 mLs Intramuscular Given 09/12/22 1843)    Initial Impression / Assessment and Plan / UC Course   I have reviewed the triage vital signs and the nursing notes.  Pertinent labs & imaging results that were available during my care of the patient were reviewed by me and considered in my medical decision making (see chart for details).   Patient is a nontoxic-appearing 31 year old male here for evaluation of a laceration to sustained to the dorsal aspect of the proximal phalanx of his left fourth finger approximately 5 hours prior to arrival.  He is unsure when his last tetanus shot was so we will update it.  The laceration is diagonal and it does gape open when he opens and closes hand but he has forage of motion of his hands and fingers.  Also full sensation.  I will anesthetize the wound with 1% lidocaine without epi and closed with sutures.  I will discharge patient home on Keflex 500 mg twice daily for 5 days to prevent infection.  The 2 cm laceration was anesthetized with 2 mils of 1% lidocaine without epi.  Good anesthesia was achieved.  I explored the wound through the full depth and did not observe any tendon injury or damage.  The wound was cleansed with chlorhexidine and saline, rinsed with saline, and then closed using 3 simple interrupted sutures of 5-0 Prolene.  The wound was again cleansed with chlorhexidine and saline, rinsed with saline, and a dressing of bacitracin, nonadherent padding, and Coban was applied.  Patient tolerated procedure well.   Final Clinical Impressions(s) / UC Diagnoses   Final diagnoses:  Laceration of left ring finger without foreign body without damage to nail, initial encounter     Discharge Instructions      Leave the dressing in place for the next 24 hours.  After 24 hours remove the dressing, wash the wound with warm water and soap, pat it dry, and apply a thin smear of bacitracin.  Clean the wound daily and apply bacitracin for the first 2 days.  After that a scab should have started to form and you can leave the wound open to air when you are at  home and cover with a Band-Aid when you go out in public.  Take the Keflex three times daily for 5 days for prevention of wound infection.  Sutures remain in place for 10 days, please return here or see  your primary care provider for removal.  If you develop any redness at the wound site, swelling, pain, drainage, red streaks going up your hand, or fever please return for reevaluation.      ED Prescriptions     Medication Sig Dispense Auth. Provider   cephALEXin (KEFLEX) 500 MG capsule  (Status: Discontinued) Take 1 capsule (500 mg total) by mouth 3 (three) times daily for 5 days. 20 capsule Becky Augusta, NP   cephALEXin (KEFLEX) 500 MG capsule Take 1 capsule (500 mg total) by mouth 3 (three) times daily for 5 days. 15 capsule Becky Augusta, NP      PDMP not reviewed this encounter.   Becky Augusta, NP 09/12/22 1935

## 2023-07-25 ENCOUNTER — Ambulatory Visit: Payer: 59 | Admitting: Nurse Practitioner

## 2023-09-10 ENCOUNTER — Ambulatory Visit: Payer: 59 | Admitting: Family Medicine

## 2023-09-17 ENCOUNTER — Encounter: Payer: Self-pay | Admitting: Urology

## 2023-09-17 ENCOUNTER — Ambulatory Visit (INDEPENDENT_AMBULATORY_CARE_PROVIDER_SITE_OTHER): Payer: No Typology Code available for payment source | Admitting: Urology

## 2023-09-17 VITALS — BP 133/66 | HR 82 | Ht 66.0 in | Wt 165.0 lb

## 2023-09-17 DIAGNOSIS — Z3009 Encounter for other general counseling and advice on contraception: Secondary | ICD-10-CM

## 2023-09-17 MED ORDER — DIAZEPAM 5 MG PO TABS: 5.0000 mg | ORAL_TABLET | Freq: Once | ORAL | 0 refills | Status: DC | PRN

## 2023-09-17 NOTE — Patient Instructions (Addendum)
Pre-Vasectomy Instructions  STOP all aspirin or blood thinners (Aspirin, Plavix, Coumadin, Warfarin, Motrin, Ibuprofen, Advil, Aleve, Naproxen, Naprosyn) for 7 days prior to the procedure.  If you have any questions about stopping these medications please contact your primary care physician or cardiologist.  Shave all hair from the upper scrotum on the day of the procedure.  This means just under the penis onto the scrotal sac.  The area shaved should measure about 2-3 inches around.  You may lather the scrotum with soap and water, and shave with a safety razor.  After shaving the area, thoroughly wash the penis and the scrotum, then shower or bathe to remove all the loose hairs.  If needed, wash the area again just before coming in for your Vasectomy.  It is recommended to have a light meal an hour or so prior to the procedure.  Bring a scrotal support (jock strap or suspensory, or tight jockey shorts or underwear).  Wear comfortable pants or shorts.  While the actual procedure usually takes about 15 minutes, you should be prepared to stay in the office for approximately one hour.  Bring someone with you to drive you home.  If you have any questions or concerns, please feel free to call the office at [phone].  Vasectomy Vasectomy is a procedure to cut and then tie or burn the ends of the vas deferens. The vas deferens is a tube that carries sperm from the testicle to the urethra. This procedure blocks sperm from being released during sex. This ensures that sperm does not go into the vagina. A vasectomy does not affect your ability to have sex or your desire for sex. Also, it does not prevent sexually transmitted infections, or STIs. Vasectomy is a permanent and effective form of birth control. You should have a vasectomy only when you and your partner are sure you do not want children in the future. Do not get this procedure when you are stressed, such as after divorce or pregnancy  loss. Tell a health care provider about: Any allergies you have. All medicines you are taking. These include vitamins, herbs, eye drops, creams, and over-the-counter medicines. Any problems you or family members have had with anesthesia. Any bleeding problems you have. Any surgeries you have had. Any medical conditions you have. What are the risks? Your provider will talk with you about risks. These may include: Infection. Bleeding and swelling of the scrotum. The scrotum is the sac that contains the testicles. Allergies to medicines. Failure of the procedure to prevent pregnancy. There is a very small chance that the tied or burned parts of the vas deferens may reconnect. If this happens, you could still make a person pregnant. Pain in the scrotum that goes on after you heal from the procedure. What happens before the procedure? Medicines Ask your health care provider about: Changing or stopping your regular medicines. These include any diabetes medicines or blood thinners you take. Taking medicines such as aspirin and ibuprofen. These medicines can thin your blood. Do not take them unless your provider tells you to take them. Taking over-the-counter medicines, vitamins, herbs, and supplements. You may be told to take a sedative a few hours before the procedure. A sedative helps you relax. Surgery safety Ask your provider: How your surgery site will be marked. What steps will be taken to help prevent infection. These steps may include: Removing hair at the surgery site. Washing skin with a soap that kills germs. Taking antibiotics. General instructions Do  not use any products that contain nicotine or tobacco for at least 4 weeks before the procedure. These products include cigarettes, chewing tobacco, and vaping devices, such as e-cigarettes. If you need help quitting, ask your provider. If you'll be going home right after the procedure, plan to have a responsible adult: Take you  home from the hospital or clinic. You'll not be allowed to drive. Care for you for the time you are told. What happens during the procedure?  You may be given: A sedative. This helps you relax. You may also be told to take this a few hours before the procedure. Anesthesia. This keeps you from feeling pain. It will numb certain areas of your body. Your provider will feel for your vas deferens. To get to the vas deferens, your provider may: Make a very small cut, or incision, in your scrotum. Make a hole by piercing the scrotum. Your vas deferens will be pulled out of your scrotum and cut. To close it, the cut ends of the vas deferens will be tied or burned. The vas deferens will be put back into your scrotum. The cut or the hole in the scrotum will be closed with stitches. The stitches will dissolve and will not need to be removed. The procedure will be done again on the other side of your scrotum. The procedure may vary among providers and hospitals. What happens after the procedure? You will be monitored to make sure that you do not have problems. You will be asked not to ejaculate for at least 1 week after the procedure, or for as long as you are told. You will need to use another form of birth control for 2-4 months after the procedure. Do this until your provider confirms that there's no sperm in your semen. You may be given something to wear to support your scrotum. This includes a jockstrap or underwear with a pouch. If you were given a sedative during your procedure, do not drive or use machines until your provider says that it's safe. This information is not intended to replace advice given to you by your health care provider. Make sure you discuss any questions you have with your health care provider. Document Revised: 01/21/2023 Document Reviewed: 01/21/2023 Elsevier Patient Education  2024 [employer].

## 2023-09-17 NOTE — Progress Notes (Signed)
   09/17/23 3:28 PM   Brad Nichols 02/14/1991 [id number]  CC: Discuss vasectomy  HPI: Healthy 32 year old male here with his wife today to discuss vasectomy.  They have 2 children and do not desire any further biologic pregnancies.  He denies any urinary symptoms, no family history of prostate cancer.   PMH: History reviewed. No pertinent past medical history.  Surgical History: Past Surgical History:  Procedure Laterality Date   ESOPHAGOGASTRODUODENOSCOPY (EGD) WITH PROPOFOL N/A 11/16/2019   Procedure: ESOPHAGOGASTRODUODENOSCOPY (EGD) WITH PROPOFOL;  Surgeon: Midge Minium, MD;  Location: [facility] ENDOSCOPY;  Service: Endoscopy;  Laterality: N/A;    Family History: History reviewed. No pertinent family history.  Social History:  reports that he has been smoking cigarettes. He has never used smokeless tobacco. He reports that he does not currently use alcohol. He reports that he does not use drugs.  Physical Exam: BP 133/66 (BP Location: Left Arm, Patient Position: Sitting, Cuff Size: Normal)   Pulse 82   Ht 5\' 6"  (1.676 m)   Wt 165 lb (74.8 kg)   BMI 26.63 kg/m    Constitutional:  Alert and oriented, No acute distress. Cardiovascular: No clubbing, cyanosis, or edema. Respiratory: Normal respiratory effort, no increased work of breathing. GI: Abdomen is soft, nontender, nondistended, no abdominal masses GU: Circumcised phallus with patent meatus, no lesions, testicles 20 cc and descended bilaterally without masses, vas deferens easily palpable bilaterally  Assessment & Plan:   Healthy 32 year old male interested in vasectomy for permanent sterilization.  We discussed the risks and benefits of vasectomy at length.  Vasectomy is intended to be a permanent form of contraception, and does not produce immediate sterility.  Following vasectomy another form of contraception is required until vas occlusion is confirmed by a post-vasectomy semen analysis obtained 2-3 months after  the procedure.  Even after vas occlusion is confirmed, vasectomy is not 100% reliable in preventing pregnancy, and the failure rate is approximately 12/1998.  Repeat vasectomy is required in less than 1% of patients.  He should refrain from ejaculation for 1 week after vasectomy.  Options for fertility after vasectomy include vasectomy reversal, and sperm retrieval with in vitro fertilization or ICSI.  These options are not always successful and may be expensive.  Finally, there are other permanent and non-permanent alternatives to vasectomy available. There is no risk of erectile dysfunction, and the volume of semen will be similar to prior, as the majority of the ejaculate is from the prostate and seminal vesicles.   The procedure takes ~20 minutes.  We recommend patients take 5-10 mg of Valium 30 minutes prior, and he will need a driver post-procedure.  Local anesthetic is injected into the scrotal skin and a small segment of the vas deferens is removed, and the ends occluded. The complication rate is approximately 1-2%, and includes bleeding, infection, and development of chronic scrotal pain.  PLAN: Schedule vasectomy Valium sent to pharmacy   Legrand Rams, MD 09/17/2023  [facility] [address], [address] [place], [place] [address] [phone]

## 2023-10-23 ENCOUNTER — Encounter: Payer: Self-pay | Admitting: Nurse Practitioner

## 2023-10-23 ENCOUNTER — Ambulatory Visit: Payer: No Typology Code available for payment source | Admitting: Nurse Practitioner

## 2023-10-23 ENCOUNTER — Encounter: Payer: No Typology Code available for payment source | Admitting: Urology

## 2023-10-23 VITALS — BP 118/80 | HR 57 | Temp 98.0°F | Ht 66.0 in | Wt 170.2 lb

## 2023-10-23 DIAGNOSIS — K200 Eosinophilic esophagitis: Secondary | ICD-10-CM

## 2023-10-23 DIAGNOSIS — F419 Anxiety disorder, unspecified: Secondary | ICD-10-CM | POA: Diagnosis not present

## 2023-10-23 DIAGNOSIS — L309 Dermatitis, unspecified: Secondary | ICD-10-CM | POA: Diagnosis not present

## 2023-10-23 DIAGNOSIS — J309 Allergic rhinitis, unspecified: Secondary | ICD-10-CM

## 2023-10-23 DIAGNOSIS — F32A Depression, unspecified: Secondary | ICD-10-CM

## 2023-10-23 MED ORDER — ESCITALOPRAM OXALATE 10 MG PO TABS
10.0000 mg | ORAL_TABLET | Freq: Every day | ORAL | 0 refills | Status: DC
Start: 2023-10-23 — End: 2024-01-20

## 2023-10-23 MED ORDER — HYDROXYZINE HCL 10 MG PO TABS: 10.0000 mg | ORAL_TABLET | Freq: Three times a day (TID) | ORAL | 0 refills | Status: DC | PRN

## 2023-10-23 MED ORDER — CETIRIZINE HCL 5 MG/5ML PO SOLN: 10.0000 mg | Freq: Every day | ORAL | 5 refills | Status: AC

## 2023-10-23 NOTE — Assessment & Plan Note (Signed)
He has a history of esophagitis, previously managed with throat stretching and steroid inhaler use, but is now experiencing slowly returning symptoms, causing anxiety and panic attacks. We will refer him back to [facility] for follow-up and management.

## 2023-10-23 NOTE — Assessment & Plan Note (Signed)
He has a history of anxiety and depression, currently experiencing panic attacks a few times a week, exacerbated by life stressors and health concerns. PHQ- 10 and GAD- 15 today. We will start Lexapro 10 mg daily, provide Hydroxyzine 10 mg 1-2 tablets as needed for acute anxiety episodes and sleep, and follow up in 4-6 weeks to assess mood and medication effectiveness. Counseled patient on common side effects. Encouraged to contact if worsening symptoms, unusual behavior changes or suicidal thoughts occur.

## 2023-10-23 NOTE — Progress Notes (Signed)
Brad Dicker, NP-C Phone: [phone]  Brad Nichols is a 32 y.o. male who presents today to establish care.   Discussed the use of AI scribe software for clinical note transcription with the patient, who gave verbal consent to proceed.  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 The patient reports experiencing panic attacks a few times a week, often triggered by allergy symptoms, difficulty swallowing, and life stressors. The patient's anxiety is particularly heightened during meal times due to a past medical history of throat issues that resulted in difficulty swallowing solid foods for a year. The patient lost significant weight during this period and underwent a procedure to stretch the throat. The patient was also treated with a steroid inhaler and other medications. The patient reports that these throat issues are slowly returning, causing increased anxiety.  The patient also reports chronic nasal congestion, which is not relieved by blowing the nose. Previous use of over-the-counter antihistamines resulted in excessive dryness and frequent nosebleeds, leading the patient to switch to liquid Zyrtec, which was more tolerable. The patient also reports a history of eczema, with recent onset of a small spot on the scalp. The patient also reports difficulty sleeping, particularly when anxiety levels are high.  The patient has previously been treated with Lexapro for anxiety and depression, and underwent therapy, which was initially beneficial but eventually felt redundant. The patient expresses a reluctance to rely on medication but acknowledges the potential benefits if it can help manage the current symptoms. The patient also reports a history of snoring and potential issues with a deviated septum, which may be contributing to the chronic nasal congestion. The patient has not seen  an ENT specialist in the past two years.      Active Ambulatory Problems    Diagnosis Date Noted   Esophageal dysphagia    Anxiety and depression 01/13/2020   Eosinophilic esophagitis 01/23/2020   Allergic rhinitis 10/23/2023   Eczema 10/23/2023   Resolved Ambulatory Problems    Diagnosis Date Noted   No Resolved Ambulatory Problems   Past Medical History:  Diagnosis Date   Depression    Eating disorder    GERD (gastroesophageal reflux disease)     Family History  Problem Relation Age of Onset   COPD Mother    Asthma Mother    Hypertension Father    Diabetes Father    Depression Father    Alcohol abuse Father     Social History   Socioeconomic History   Marital status: Married    Spouse name: Not on file   Number of children: Not on file   Years of education: Not on file   Highest education level: Not on file  Occupational History   Not on file  Tobacco Use   Smoking status: Former    Types: Cigarettes   Smokeless tobacco: Never  Vaping Use   Vaping status: Never Used  Substance and Sexual Activity   Alcohol use: Yes   Drug use: Never   Sexual activity: Yes    Birth control/protection: None  Other Topics Concern   Not on file  Social History Narrative   Not on file   Social Determinants of Health   Financial Resource Strain: Not on file  Food Insecurity: Not on file  Transportation Needs: Not on file  Physical Activity: Not on file  Stress: Not on file  Social Connections:  Unknown (01/06/2020)   Received from [facility]   Social Connection and Isolation Panel [NHANES]    Frequency of Communication with Friends and Family: More than three times a week    Frequency of Social Gatherings with Friends and Family: More than three times a week    Attends Religious Services: Not on Marketing executive or Organizations: Not on file    Attends Banker Meetings: Not on file    Marital Status: Married  Careers information officer Violence: Not on file    ROS  General:  Negative for unexplained weight loss, fever Skin: Negative for new or changing mole, sore that won't heal HEENT: Negative for trouble hearing, trouble seeing, ringing in ears, mouth sores, hoarseness, change in voice. CV:  Negative for chest pain, dyspnea, edema, palpitations Resp: Negative for cough, dyspnea, hemoptysis GI: Negative for nausea, vomiting, diarrhea, constipation, abdominal pain, melena, hematochezia. GU: Negative for dysuria, incontinence, urinary hesitance, hematuria, vaginal or penile discharge, polyuria, sexual difficulty, lumps in testicle or breasts MSK: Negative for muscle cramps or aches, joint pain or swelling Neuro: Negative for headaches, weakness, numbness, dizziness, passing out/fainting Psych: Negative for memory problems  Objective  Physical Exam Vitals:   10/23/23 0859  BP: 118/80  Pulse: (!) 57  Temp: 98 F (36.7 C)  SpO2: 98%    BP Readings from Last 3 Encounters:  10/23/23 118/80  09/17/23 133/66  09/12/22 123/79   Wt Readings from Last 3 Encounters:  10/23/23 170 lb 3.2 oz (77.2 kg)  09/17/23 165 lb (74.8 kg)  09/12/22 162 lb (73.5 kg)    Physical Exam Constitutional:      General: He is not in acute distress.    Appearance: Normal appearance.  HENT:     Head: Normocephalic.      Comments: Small patch of eczema noted in scalp, lower right just above hair line. Mild erythema and scaling noted.     Nose: Congestion present.     Left Turbinates: Enlarged.  Cardiovascular:     Rate and Rhythm: Normal rate and regular rhythm.     Heart sounds: Normal heart sounds.  Pulmonary:     Effort: Pulmonary effort is normal.     Breath sounds: Normal breath sounds.  Skin:    General: Skin is warm and dry.  Neurological:     General: No focal deficit present.     Mental Status: He is alert.  Psychiatric:        Mood and Affect: Mood normal.        Behavior: Behavior normal.     Assessment/Plan:   Anxiety and depression Assessment & Plan: He has a history of anxiety and depression, currently experiencing panic attacks a few times a week, exacerbated by life stressors and health concerns. PHQ- 10 and GAD- 15 today. We will start Lexapro 10 mg daily, provide Hydroxyzine 10 mg 1-2 tablets as needed for acute anxiety episodes and sleep, and follow up in 4-6 weeks to assess mood and medication effectiveness. Counseled patient on common side effects. Encouraged to contact if worsening symptoms, unusual behavior changes or suicidal thoughts occur.   Orders: -     Escitalopram Oxalate; Take 1 tablet (10 mg total) by mouth daily.  Dispense: 90 tablet; Refill: 0 -     hydrOXYzine HCl; Take 1-2 tablets (10-20 mg total) by mouth 3 (three) times daily as needed for anxiety.  Dispense: 120 tablet; Refill: 0  Eosinophilic esophagitis Assessment & Plan: He has a history of esophagitis, previously managed with throat stretching and steroid inhaler use, but is now experiencing slowly returning symptoms, causing anxiety and panic attacks. We will refer him back to [facility] for follow-up and management.  Orders: -     Ambulatory referral to Gastroenterology  Allergic rhinitis, unspecified seasonality, unspecified trigger Assessment & Plan: He suffers from chronic nasal congestion, previously managed with over-the-counter antihistamines, which led to excessive dryness and nosebleeds. We will start liquid Zyrtec daily and recommend over-the-counter saline nasal spray to maintain nasal moisture. He also feels that he has a nasal obstruction, worse on the left side, and snoring. We will refer him to an Ear, Nose, and Throat (ENT) specialist for further evaluation and management.   Orders: -     Ambulatory referral to ENT -     Cetirizine HCl; Take 10 mLs (10 mg total) by mouth daily.  Dispense: 300 mL; Refill: 5  Eczema, unspecified type Assessment & Plan: He has a  history of eczema, with a current small spot on the scalp and occasional spots on the back and chest. He notes the spot on his scalp is not bothersome and does not itch. We recommend over-the-counter Aquaphor Eczema Therapy or Cerave lotion for skin hydration. He will contact if worsening or changing.     Return in about 4 weeks (around 11/20/2023) for Annual Exam.   Brad Dicker, NP-C [facility] [employer]

## 2023-10-23 NOTE — Assessment & Plan Note (Addendum)
He has a history of eczema, with a current small spot on the scalp and occasional spots on the back and chest. He notes the spot on his scalp is not bothersome and does not itch. We recommend over-the-counter Aquaphor Eczema Therapy or Cerave lotion for skin hydration. He will contact if worsening or changing.

## 2023-10-23 NOTE — Assessment & Plan Note (Addendum)
He suffers from chronic nasal congestion, previously managed with over-the-counter antihistamines, which led to excessive dryness and nosebleeds. We will start liquid Zyrtec daily and recommend over-the-counter saline nasal spray to maintain nasal moisture. He also feels that he has a nasal obstruction, worse on the left side, and snoring. We will refer him to an Ear, Nose, and Throat (ENT) specialist for further evaluation and management.

## 2023-11-19 ENCOUNTER — Ambulatory Visit (INDEPENDENT_AMBULATORY_CARE_PROVIDER_SITE_OTHER): Payer: No Typology Code available for payment source | Admitting: Nurse Practitioner

## 2023-11-19 ENCOUNTER — Encounter: Payer: Self-pay | Admitting: Nurse Practitioner

## 2023-11-19 VITALS — BP 102/60 | HR 76 | Temp 98.8°F | Ht 66.0 in | Wt 172.8 lb

## 2023-11-19 DIAGNOSIS — F32A Depression, unspecified: Secondary | ICD-10-CM | POA: Diagnosis not present

## 2023-11-19 DIAGNOSIS — F419 Anxiety disorder, unspecified: Secondary | ICD-10-CM | POA: Diagnosis not present

## 2023-11-19 DIAGNOSIS — Z131 Encounter for screening for diabetes mellitus: Secondary | ICD-10-CM

## 2023-11-19 DIAGNOSIS — Z1329 Encounter for screening for other suspected endocrine disorder: Secondary | ICD-10-CM

## 2023-11-19 DIAGNOSIS — Z13220 Encounter for screening for lipoid disorders: Secondary | ICD-10-CM | POA: Diagnosis not present

## 2023-11-19 DIAGNOSIS — Z0000 Encounter for general adult medical examination without abnormal findings: Secondary | ICD-10-CM | POA: Insufficient documentation

## 2023-11-19 NOTE — Progress Notes (Signed)
[name], NP-C Phone: [phone]  Brad Nichols is a 32 y.o. male who presents today for annual exam.   Discussed the use of AI scribe software for clinical note transcription with the patient, who gave verbal consent to proceed.  History of Present Illness   The patient, who was started on Lexapro four weeks ago, reports mixed feelings about the medication. He appreciates the reduction in anxiety and the quieting of his mind. However, he has experienced a decrease in appetite and a decrease in sex drive. He also reports a couple of days of unusual thoughts, but is unsure if this was related to the medication or other factors. The patient's spouse has noticed a slowing in the patient's cognitive function, which he is unsure is related to the medication.  The patient also reports a decrease in the need for his as-needed medication, indicating a significant decrease in anxiety. He has not followed up with GI or ENT as previously discussed due to a decrease in symptoms, possibly related to the Lexapro and Zyrtec.  He reports a change in sleep patterns, with increased tiredness and earlier bedtimes since starting the Lexapro. He also reports constipation when first starting the medication, which has since resolved.  The patient admits to a poor diet, but has made improvements in his water intake. He has a history of indigestion and heartburn, which he believes was exacerbated by his previous high soda intake. He reports occasional social use of alcohol and marijuana, but denies any other drug use. 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  The patient has a history of eczema and chronic congestion, which he feels has improved with the use of Zyrtec. He has not noticed any significant changes in his skin. He denies any new symptoms or concerns at this time.      Social History   Tobacco Use  Smoking Status  Former   Types: Cigarettes  Smokeless Tobacco Never    Current Outpatient Medications on File Prior to Visit  Medication Sig Dispense Refill   cetirizine HCl (ZYRTEC) 5 MG/5ML SOLN Take 10 mLs (10 mg total) by mouth daily. 300 mL 5   escitalopram (LEXAPRO) 10 MG tablet Take 1 tablet (10 mg total) by mouth daily. 90 tablet 0   hydrOXYzine (ATARAX) 10 MG tablet Take 1-2 tablets (10-20 mg total) by mouth 3 (three) times daily as needed for anxiety. 120 tablet 0   No current facility-administered medications on file prior to visit.    ROS see history of present illness  Objective  Physical Exam Vitals:   11/19/23 1429  BP: 102/60  Pulse: 76  Temp: 98.8 F (37.1 C)  SpO2: 96%    BP Readings from Last 3 Encounters:  11/19/23 102/60  10/23/23 118/80  09/17/23 133/66   Wt Readings from Last 3 Encounters:  11/19/23 172 lb 12.8 oz (78.4 kg)  10/23/23 170 lb 3.2 oz (77.2 kg)  09/17/23 165 lb (74.8 kg)    Physical Exam Constitutional:      General: He is not in acute distress.    Appearance: Normal appearance.  HENT:     Head: Normocephalic.     Right Ear: Tympanic membrane normal.     Left Ear: Tympanic membrane normal.     Nose: Nose normal.     Mouth/Throat:     Mouth: Mucous membranes are moist.     Pharynx: Oropharynx is  clear.  Eyes:     Conjunctiva/sclera: Conjunctivae normal.     Pupils: Pupils are equal, round, and reactive to light.  Neck:     Thyroid: No thyromegaly.  Cardiovascular:     Rate and Rhythm: Normal rate and regular rhythm.     Heart sounds: Normal heart sounds.  Pulmonary:     Effort: Pulmonary effort is normal.     Breath sounds: Normal breath sounds.  Abdominal:     General: Abdomen is flat. Bowel sounds are normal.     Palpations: Abdomen is soft. There is no mass.     Tenderness: There is no abdominal tenderness.  Musculoskeletal:        General: Normal range of motion.  Lymphadenopathy:     Cervical: No cervical adenopathy.   Skin:    General: Skin is warm and dry.     Findings: No rash.  Neurological:     General: No focal deficit present.     Mental Status: He is alert.  Psychiatric:        Mood and Affect: Mood normal.        Behavior: Behavior normal.    Assessment/Plan: Please see individual problem list.  Preventative health care Assessment & Plan: Physical exam complete. We will order routine blood work and contact patient with results. He declined the flu vaccine today. His tetanus vaccine is up to date. He declines all COVID vaccines. Encouraged improvement in diet and consistent hydration. Recommended establishing with dentist and follow up with eye doctor for annual exams. Follow-up in 2 months to reassess mood and anxiety.   Orders: -     CBC with Differential/Platelet -     Comprehensive metabolic panel  Anxiety and depression Assessment & Plan: He has noted improvement in anxiety and mood with Lexapro but reported decreased appetite, decreased libido, and cognitive slowing. PHQ- 10 and GAD- 5 today. We discussed the possibility of these side effects improving over time as his body continues to adjust to the medication. We will continue Lexapro at the current dose and switch Lexapro to nighttime dosing to potentially mitigate fatigue. Encouraged to contact if worsening symptoms, unusual behavior changes or suicidal thoughts occur. We will reevaluate in 2 months or sooner if side effects persist or worsen.   Orders: -     VITAMIN D 25 Hydroxy (Vit-D Deficiency, Fractures)  Lipid screening -     Lipid panel  Thyroid disorder screen -     TSH  Diabetes mellitus screening -     Hemoglobin A1c   Return in about 2 months (around 01/20/2024) for Anxiety/Depression.   [name], NP-C  Primary Care - [facility]

## 2023-11-19 NOTE — Assessment & Plan Note (Signed)
He has noted improvement in anxiety and mood with Lexapro but reported decreased appetite, decreased libido, and cognitive slowing. PHQ- 10 and GAD- 5 today. We discussed the possibility of these side effects improving over time as his body continues to adjust to the medication. We will continue Lexapro at the current dose and switch Lexapro to nighttime dosing to potentially mitigate fatigue. Encouraged to contact if worsening symptoms, unusual behavior changes or suicidal thoughts occur. We will reevaluate in 2 months or sooner if side effects persist or worsen.

## 2023-11-19 NOTE — Assessment & Plan Note (Signed)
Physical exam complete. We will order routine blood work and contact patient with results. He declined the flu vaccine today. His tetanus vaccine is up to date. He declines all COVID vaccines. Encouraged improvement in diet and consistent hydration. Recommended establishing with dentist and follow up with eye doctor for annual exams. Follow-up in 2 months to reassess mood and anxiety.

## 2023-11-20 ENCOUNTER — Other Ambulatory Visit: Payer: Self-pay | Admitting: Nurse Practitioner

## 2023-11-20 DIAGNOSIS — E559 Vitamin D deficiency, unspecified: Secondary | ICD-10-CM

## 2023-11-20 LAB — COMPREHENSIVE METABOLIC PANEL
ALT: 25 U/L (ref 0–53)
AST: 25 U/L (ref 0–37)
Albumin: 4.6 g/dL (ref 3.5–5.2)
Alkaline Phosphatase: 77 U/L (ref 39–117)
BUN: 15 mg/dL (ref 6–23)
CO2: 27 meq/L (ref 19–32)
Calcium: 9.1 mg/dL (ref 8.4–10.5)
Chloride: 106 meq/L (ref 96–112)
Creatinine, Ser: 0.83 mg/dL (ref 0.40–1.50)
GFR: 116.97 mL/min (ref >60.00)
Glucose, Bld: 92 mg/dL (ref 70–99)
Potassium: 4.1 meq/L (ref 3.5–5.1)
Sodium: 140 meq/L (ref 135–145)
Total Bilirubin: 0.9 mg/dL (ref 0.2–1.2)
Total Protein: 6.9 g/dL (ref 6.0–8.3)

## 2023-11-20 LAB — LIPID PANEL
Cholesterol: 196 mg/dL (ref 0–200)
HDL: 43.3 mg/dL (ref >39.00)
LDL Cholesterol: 117 mg/dL — ABNORMAL HIGH (ref 0–99)
NonHDL: 152.5
Total CHOL/HDL Ratio: 5
Triglycerides: 178 mg/dL — ABNORMAL HIGH (ref 0.0–149.0)
VLDL: 35.6 mg/dL (ref 0.0–40.0)

## 2023-11-20 LAB — HEMOGLOBIN A1C: Hgb A1c MFr Bld: 5.6 % (ref 4.6–6.5)

## 2023-11-20 LAB — CBC WITH DIFFERENTIAL/PLATELET
Basophils Absolute: 0 10*3/uL (ref 0.0–0.1)
Basophils Relative: 0.5 % (ref 0.0–3.0)
Eosinophils Absolute: 0.3 10*3/uL (ref 0.0–0.7)
Eosinophils Relative: 3.3 % (ref 0.0–5.0)
HCT: 42.7 % (ref 39.0–52.0)
Hemoglobin: 14.4 g/dL (ref 13.0–17.0)
Lymphocytes Relative: 29.1 % (ref 12.0–46.0)
Lymphs Abs: 2.2 10*3/uL (ref 0.7–4.0)
MCHC: 33.7 g/dL (ref 30.0–36.0)
MCV: 86.7 fL (ref 78.0–100.0)
Monocytes Absolute: 0.5 10*3/uL (ref 0.1–1.0)
Monocytes Relative: 6.7 % (ref 3.0–12.0)
Neutro Abs: 4.7 10*3/uL (ref 1.4–7.7)
Neutrophils Relative %: 60.4 % (ref 43.0–77.0)
Platelets: 273 10*3/uL (ref 150.0–400.0)
RBC: 4.93 Mil/uL (ref 4.22–5.81)
RDW: 12.8 % (ref 11.5–15.5)
WBC: 7.7 10*3/uL (ref 4.0–10.5)

## 2023-11-20 LAB — VITAMIN D 25 HYDROXY (VIT D DEFICIENCY, FRACTURES): VITD: 11.66 ng/mL — ABNORMAL LOW (ref 30.00–100.00)

## 2023-11-20 LAB — TSH: TSH: 1.35 u[IU]/mL (ref 0.35–5.50)

## 2023-11-20 MED ORDER — VITAMIN D (ERGOCALCIFEROL) 1.25 MG (50000 UNIT) PO CAPS: 50000.0000 [IU] | ORAL_CAPSULE | ORAL | 1 refills | Status: DC

## 2023-11-25 ENCOUNTER — Telehealth: Payer: Self-pay

## 2023-11-25 NOTE — Telephone Encounter (Signed)
Nd attempt to call pt did not leave a 2nd vm

## 2023-11-25 NOTE — Telephone Encounter (Signed)
 VM left informing pt to CB in regards to labs

## 2024-01-18 ENCOUNTER — Other Ambulatory Visit: Payer: Self-pay | Admitting: Nurse Practitioner

## 2024-01-18 DIAGNOSIS — F419 Anxiety disorder, unspecified: Secondary | ICD-10-CM

## 2024-01-20 ENCOUNTER — Ambulatory Visit: Payer: 59 | Admitting: Nurse Practitioner

## 2024-01-30 ENCOUNTER — Telehealth (INDEPENDENT_AMBULATORY_CARE_PROVIDER_SITE_OTHER): Payer: 59 | Admitting: Nurse Practitioner

## 2024-01-30 DIAGNOSIS — F419 Anxiety disorder, unspecified: Secondary | ICD-10-CM

## 2024-01-30 DIAGNOSIS — N539 Unspecified male sexual dysfunction: Secondary | ICD-10-CM | POA: Diagnosis not present

## 2024-01-30 DIAGNOSIS — F32A Depression, unspecified: Secondary | ICD-10-CM | POA: Diagnosis not present

## 2024-01-30 MED ORDER — BUSPIRONE HCL 7.5 MG PO TABS: 7.5000 mg | ORAL_TABLET | Freq: Two times a day (BID) | ORAL | 2 refills | Status: DC

## 2024-01-30 NOTE — Assessment & Plan Note (Signed)
 Likely secondary to Lexapro. Plan to monitor after adding Buspar. Monitor for changes in libido after starting Buspar.

## 2024-01-30 NOTE — Progress Notes (Signed)
 MyChart Video Visit    Virtual Visit via Video Note   This visit type was conducted because this format is felt to be most appropriate for this patient at this time. Physical exam was limited by quality of the video and audio technology used for the visit. CMA was able to get the patient set up on a video visit.  Patient location: Work. Patient and provider in visit Provider location: Office  I discussed the limitations of evaluation and management by telemedicine and the availability of in person appointments. The patient expressed understanding and agreed to proceed.  Visit Date: 01/30/2024  Today's healthcare provider: [name], NP     Subjective:    Patient ID: Brad Nichols, male    DOB: 01/07/1991, 33 y.o.   MRN: [id number]  No chief complaint on file.   HPI  Discussed the use of AI scribe software for clinical note transcription with the patient, who gave verbal consent to proceed.  History of Present Illness   Brad Nichols is a 33 year old male who presents with concerns about decreased libido while on Lexapro.  He has experienced a significant decrease in libido since starting Lexapro, describing it as having 'zero sex drive', which is beginning to affect his life. His wife had a similar experience with Buspar, which increased her libido.  His mood is generally well-controlled on Lexapro, but he continues to experience anxiety. He is currently taking Atarax twice a day to manage his anxiety symptoms. 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 No current depression symptoms.      Past Medical History:  Diagnosis Date   Depression    Eating disorder    GERD (gastroesophageal reflux disease)      Past Surgical History:  Procedure Laterality Date   ESOPHAGOGASTRODUODENOSCOPY (EGD) WITH PROPOFOL N/A 11/16/2019   Procedure: ESOPHAGOGASTRODUODENOSCOPY (EGD) WITH PROPOFOL;  Surgeon: Midge Minium, MD;  Location: [facility] ENDOSCOPY;  Service: Endoscopy;  Laterality: N/A;    Family History  Problem Relation Age of Onset   COPD Mother    Asthma Mother    Hypertension Father    Diabetes Father    Depression Father    Alcohol abuse Father     Social History   Socioeconomic History   Marital status: Married    Spouse name: Not on file   Number of children: Not on file   Years of education: Not on file   Highest education level: 12th grade  Occupational History   Not on file  Tobacco Use   Smoking status: Former    Types: Cigarettes   Smokeless tobacco: Never  Vaping Use   Vaping status: Never Used  Substance and Sexual Activity   Alcohol use: Yes   Drug use: Never   Sexual activity: Yes    Birth control/protection: None  Other Topics Concern   Not on file  Social History Narrative   Not on file   Social Drivers of Health   Financial Resource Strain: Medium Risk (01/30/2024)   Overall Financial Resource Strain (CARDIA)    Difficulty of Paying Living Expenses: Somewhat hard  Food Insecurity: No Food Insecurity (01/30/2024)   Hunger Vital Sign    Worried About Running Out of Food in the Last Year: Never true  Ran Out of Food in the Last Year: Never true  Transportation Needs: No Transportation Needs (01/30/2024)   PRAPARE - Administrator, Civil Service (Medical): No    Lack of Transportation (Non-Medical): No  Physical Activity: Sufficiently Active (01/30/2024)   Exercise Vital Sign    Days of Exercise per Week: 5 days    Minutes of Exercise per Session: 120 min  Stress: Stress Concern Present (01/30/2024)   [employer] of Occupational Health - Occupational Stress Questionnaire    Feeling of Stress : Rather much  Social Connections: Moderately  Isolated (01/30/2024)   Social Connection and Isolation Panel [NHANES]    Frequency of Communication with Friends and Family: Once a week    Frequency of Social Gatherings with Friends and Family: Never    Attends Religious Services: More than 4 times per year    Active Member of [employer] or Organizations: No    Attends Engineer, structural: Not on file    Marital Status: Married  Catering manager Violence: Not on file    Outpatient Medications Prior to Visit  Medication Sig Dispense Refill   Vitamin D, Ergocalciferol, (DRISDOL) 1.25 MG (50000 UNIT) CAPS capsule Take 1 capsule (50,000 Units total) by mouth every 7 (seven) days. 13 capsule 1   cetirizine HCl (ZYRTEC) 5 MG/5ML SOLN Take 10 mLs (10 mg total) by mouth daily. 300 mL 5   escitalopram (LEXAPRO) 10 MG tablet TAKE 1 TABLET BY MOUTH EVERY DAY 90 tablet 3   hydrOXYzine (ATARAX) 10 MG tablet Take 1-2 tablets (10-20 mg total) by mouth 3 (three) times daily as needed for anxiety. 120 tablet 0   No facility-administered medications prior to visit.    Allergies  Allergen Reactions   Beef Allergy Anxiety and Swelling   Other Anxiety and Swelling   Molds & Smuts     ROS See HPI    Objective:    Physical Exam  There were no vitals taken for this visit. Wt Readings from Last 3 Encounters:  11/19/23 172 lb 12.8 oz (78.4 kg)  10/23/23 170 lb 3.2 oz (77.2 kg)  09/17/23 165 lb (74.8 kg)   GENERAL: alert, oriented, appears well and in no acute distress   HEENT: atraumatic, conjunttiva clear, no obvious abnormalities on inspection of external nose and ears   NECK: normal movements of the head and neck   LUNGS: on inspection no signs of respiratory distress, breathing rate appears normal, no obvious gross SOB, gasping or wheezing   CV: no obvious cyanosis   MS: moves all visible extremities without noticeable abnormality   PSYCH/NEURO: pleasant and cooperative, no obvious depression or anxiety, speech and thought  processing grossly intact    Assessment & Plan:   Problem List Items Addressed This Visit       Other   Anxiety and depression - Primary   Mood is well controlled with Lexapro, but there is sexual dysfunction. Anxiety is partially managed with Atarax twice daily. Discussed options of switching to Buspar or adding Wellbutrin. Prefers to add Buspar due to its positive effect on his partner's libido. Add Buspar 7.5 mg twice daily to the current regimen of Lexapro 10 mg daily and Atarax PRN. Follow up in two months to assess efficacy and side effects.      Relevant Medications   busPIRone (BUSPAR) 7.5 MG tablet   Male sexual dysfunction   Likely secondary to Lexapro. Plan to monitor after adding Buspar. Monitor for changes in libido after  starting Buspar.        I am having Brad Donath C. [place] start on busPIRone. I am also having him maintain his hydrOXYzine, cetirizine HCl, Vitamin D (Ergocalciferol), and escitalopram.  Meds ordered this encounter  Medications   busPIRone (BUSPAR) 7.5 MG tablet    Sig: Take 1 tablet (7.5 mg total) by mouth 2 (two) times daily.    Dispense:  60 tablet    Refill:  2    Supervising Provider:   Sherlene Shams [[id number]]   I discussed the assessment and treatment plan with the patient. The patient was provided an opportunity to ask questions and all were answered. The patient agreed with the plan and demonstrated an understanding of the instructions.   The patient was advised to call back or seek an in-person evaluation if the symptoms worsen or if the condition fails to improve as anticipated.   [name], NP [facility] at [facility] [phone] (phone) [phone] (fax)  [facility]

## 2024-01-30 NOTE — Assessment & Plan Note (Signed)
 Mood is well controlled with Lexapro, but there is sexual dysfunction. Anxiety is partially managed with Atarax twice daily. Discussed options of switching to Buspar or adding Wellbutrin. Prefers to add Buspar due to its positive effect on his partner's libido. Add Buspar 7.5 mg twice daily to the current regimen of Lexapro 10 mg daily and Atarax PRN. Follow up in two months to assess efficacy and side effects.

## 2024-02-23 ENCOUNTER — Other Ambulatory Visit: Payer: Self-pay | Admitting: Nurse Practitioner

## 2024-02-23 DIAGNOSIS — F32A Depression, unspecified: Secondary | ICD-10-CM

## 2024-03-30 ENCOUNTER — Telehealth: Payer: Self-pay

## 2024-03-30 ENCOUNTER — Telehealth: Payer: 59 | Admitting: Nurse Practitioner

## 2024-03-30 NOTE — Telephone Encounter (Signed)
 Tried to call pt twice in regards to getting him connected to his video visit detailed vm left on second call about our 10 minute grace period ant hat he has until 4:10 to get connected to the video visit or he will need to reschedule.

## 2024-07-16 ENCOUNTER — Ambulatory Visit: Admitting: Nurse Practitioner

## 2024-10-03 ENCOUNTER — Encounter: Payer: Self-pay | Admitting: Nurse Practitioner

## 2025-01-07 ENCOUNTER — Ambulatory Visit: Admitting: Nurse Practitioner

## 2025-01-07 ENCOUNTER — Encounter: Payer: Self-pay | Admitting: Nurse Practitioner

## 2025-01-07 VITALS — BP 122/84 | HR 61 | Temp 97.8°F | Ht 66.0 in | Wt 174.2 lb

## 2025-01-07 DIAGNOSIS — F32A Depression, unspecified: Secondary | ICD-10-CM

## 2025-01-07 DIAGNOSIS — R0683 Snoring: Secondary | ICD-10-CM | POA: Insufficient documentation

## 2025-01-07 MED ORDER — BUSPIRONE HCL 7.5 MG PO TABS: 7.5000 mg | ORAL_TABLET | Freq: Two times a day (BID) | ORAL | 1 refills | Status: AC

## 2025-01-07 NOTE — Progress Notes (Signed)
 " Leron Glance, NP-C Phone: [phone]  Discussed the use of AI scribe software for clinical note transcription with the patient, who gave verbal consent to proceed.  History of Present Illness   Brad Nichols is a 34 year old male who presents with anxiety and sleep disturbances.  He has a history of anxiety and was previously taking Buspar , which he stopped two weeks before Christmas due to a stomach bug and subsequent flu. This interruption in medication led to increased irritability, anger, and general malaise throughout January. He resumed Buspar  7.5 mg once daily a week ago and reports feeling much better. He is not currently taking Lexapro  or Atarax  due to concerns about side effects.  He experiences sleep disturbances, including frequent awakenings and non-restorative sleep. His partner describes his snoring as 'super bad,' but he denies any choking or gasping during sleep. His dentist advised him to consider a sleep study due to potential sleep apnea, noting that his soft palate and throat anatomy were observed during the dental visit.  He has a history of Eosinophilic Esophagitis (EOE) and takes over-the-counter medications for acid reflux and Claritin for allergies.  His social history includes working full-time and having young children, which he acknowledges contributes to his sleep issues. He is preparing for an electrical exam and is trying to establish a more structured routine.       Tobacco Use History[1]  Medications Ordered Prior to Encounter[2]   ROS see history of present illness  Objective  Physical Exam Vitals:   01/07/25 1259  BP: 122/84  Pulse: 61  Temp: 97.8 F (36.6 C)  SpO2: 97%    BP Readings from Last 3 Encounters:  01/07/25 122/84  11/19/23 102/60  10/23/23 118/80   Wt Readings from Last 3 Encounters:  01/07/25 174 lb 3.2 oz (79 kg)  11/19/23 172 lb 12.8 oz (78.4 kg)  10/23/23 170 lb 3.2 oz (77.2 kg)    Physical  Exam Constitutional:      General: He is not in acute distress.    Appearance: Normal appearance.  HENT:     Head: Normocephalic.  Cardiovascular:     Rate and Rhythm: Normal rate and regular rhythm.     Heart sounds: Normal heart sounds.  Pulmonary:     Effort: Pulmonary effort is normal.     Breath sounds: Normal breath sounds.  Skin:    General: Skin is warm and dry.  Neurological:     General: No focal deficit present.     Mental Status: He is alert.  Psychiatric:        Mood and Affect: Mood normal.        Behavior: Behavior normal.      Assessment/Plan: Please see individual problem list.  Anxiety and depression Assessment & Plan: Resumed Buspar  7.5 mg once daily, resulting in improved mood and energy. He prefers this regimen. Hydroxyzine  is available for panic attacks if needed. Continue Buspar  7.5 mg once daily and refill the prescription. Monitor anxiety symptoms and adjust medication as necessary.   Orders: -     busPIRone  HCl; Take 1 tablet (7.5 mg total) by mouth 2 (two) times daily.  Dispense: 180 tablet; Refill: 1  Snoring Assessment & Plan: Significant snoring and daytime fatigue suggest sleep apnea. A dental evaluation indicated anatomical considerations. Discussed sleep study options and referred to Dr. Jess for evaluation.   Orders: -     Pulmonary Visit     Return in about 2 months (around 03/07/2025) for  Anxiety/Depression.   Leron Glance, NP-C [facility] [place]    [1]  Social History Tobacco Use  Smoking Status Former   Types: Cigarettes  Smokeless Tobacco Never  [2]  Current Outpatient Medications on File Prior to Visit  Medication Sig Dispense Refill   cetirizine  HCl (ZYRTEC ) 5 MG/5ML SOLN Take 10 mLs (10 mg total) by mouth daily. 300 mL 5   No current facility-administered medications on file prior to visit.   "

## 2025-01-07 NOTE — Assessment & Plan Note (Signed)
 Resumed Buspar  7.5 mg once daily, resulting in improved mood and energy. He prefers this regimen. Hydroxyzine  is available for panic attacks if needed. Continue Buspar  7.5 mg once daily and refill the prescription. Monitor anxiety symptoms and adjust medication as necessary.

## 2025-01-07 NOTE — Assessment & Plan Note (Signed)
 Significant snoring and daytime fatigue suggest sleep apnea. A dental evaluation indicated anatomical considerations. Discussed sleep study options and referred to Dr. Jess for evaluation.

## 2025-03-08 ENCOUNTER — Ambulatory Visit: Admitting: Nurse Practitioner
